# Patient Record
Sex: Male | Born: 1975 | Race: Black or African American | Hispanic: No | Marital: Married | Smoking: Never smoker
Health system: Southern US, Community
[De-identification: ages and names within clinical notes are randomized; demographics above are authoritative.]

## PROBLEM LIST (undated history)

## (undated) DIAGNOSIS — E669 Obesity, unspecified: Secondary | ICD-10-CM

## (undated) DIAGNOSIS — I10 Essential (primary) hypertension: Secondary | ICD-10-CM

## (undated) DIAGNOSIS — R0789 Other chest pain: Secondary | ICD-10-CM

## (undated) DIAGNOSIS — E66811 Obesity, class 1: Secondary | ICD-10-CM

## (undated) HISTORY — DX: Essential (primary) hypertension: I10

## (undated) HISTORY — DX: Other chest pain: R07.89

## (undated) HISTORY — DX: Obesity, unspecified: E66.9

## (undated) HISTORY — DX: Obesity, class 1: E66.811

---

## 2001-03-03 ENCOUNTER — Emergency Department (HOSPITAL_COMMUNITY): Admission: EM | Admit: 2001-03-03 | Discharge: 2001-03-03 | Payer: Self-pay

## 2003-05-09 ENCOUNTER — Emergency Department (HOSPITAL_COMMUNITY): Admission: EM | Admit: 2003-05-09 | Discharge: 2003-05-09 | Payer: Self-pay | Admitting: Emergency Medicine

## 2003-05-13 ENCOUNTER — Emergency Department (HOSPITAL_COMMUNITY): Admission: EM | Admit: 2003-05-13 | Discharge: 2003-05-13 | Payer: Self-pay | Admitting: Emergency Medicine

## 2013-03-24 ENCOUNTER — Other Ambulatory Visit: Payer: Self-pay | Admitting: Pulmonary Disease

## 2018-10-27 ENCOUNTER — Other Ambulatory Visit: Payer: Self-pay

## 2018-10-27 ENCOUNTER — Encounter (HOSPITAL_COMMUNITY): Payer: Self-pay

## 2018-10-27 ENCOUNTER — Emergency Department (HOSPITAL_COMMUNITY)
Admission: EM | Admit: 2018-10-27 | Discharge: 2018-10-27 | Disposition: A | Discharge status: Home / Self Care | Payer: 59 | Attending: Emergency Medicine | Admitting: Emergency Medicine

## 2018-10-27 DIAGNOSIS — R7989 Other specified abnormal findings of blood chemistry: Secondary | ICD-10-CM

## 2018-10-27 DIAGNOSIS — R42 Dizziness and giddiness: Secondary | ICD-10-CM

## 2018-10-27 DIAGNOSIS — R202 Paresthesia of skin: Secondary | ICD-10-CM

## 2018-10-27 LAB — CBC WITH DIFFERENTIAL/PLATELET
Abs Immature Granulocytes: 0 10*3/uL (ref 0.00–0.07)
Basophils Absolute: 0 10*3/uL (ref 0.0–0.1)
Basophils Relative: 1 %
Eosinophils Absolute: 0.2 10*3/uL (ref 0.0–0.5)
Eosinophils Relative: 5 %
HCT: 47.6 % (ref 39.0–52.0)
Hemoglobin: 16 g/dL (ref 13.0–17.0)
Immature Granulocytes: 0 %
Lymphocytes Relative: 38 %
Lymphs Abs: 1.4 10*3/uL (ref 0.7–4.0)
MCH: 29.2 pg (ref 26.0–34.0)
MCHC: 33.6 g/dL (ref 30.0–36.0)
MCV: 86.9 fL (ref 80.0–100.0)
Monocytes Absolute: 0.4 10*3/uL (ref 0.1–1.0)
Monocytes Relative: 10 %
Neutro Abs: 1.8 10*3/uL (ref 1.7–7.7)
Neutrophils Relative %: 46 %
Platelets: 195 10*3/uL (ref 150–400)
RBC: 5.48 MIL/uL (ref 4.22–5.81)
RDW: 12.2 % (ref 11.5–15.5)
WBC: 3.8 10*3/uL — ABNORMAL LOW (ref 4.0–10.5)
nRBC: 0 % (ref 0.0–0.2)

## 2018-10-27 LAB — URINALYSIS, ROUTINE W REFLEX MICROSCOPIC
Bilirubin Urine: NEGATIVE
Glucose, UA: NEGATIVE mg/dL
Hgb urine dipstick: NEGATIVE
Ketones, ur: NEGATIVE mg/dL
Leukocytes,Ua: NEGATIVE
Nitrite: NEGATIVE
Protein, ur: NEGATIVE mg/dL
Specific Gravity, Urine: 1.021 (ref 1.005–1.030)
pH: 6 (ref 5.0–8.0)

## 2018-10-27 LAB — BASIC METABOLIC PANEL
Anion gap: 9 (ref 5–15)
BUN: 20 mg/dL (ref 6–20)
CO2: 23 mmol/L (ref 22–32)
Calcium: 9 mg/dL (ref 8.9–10.3)
Chloride: 105 mmol/L (ref 98–111)
Creatinine, Ser: 1.38 mg/dL — ABNORMAL HIGH (ref 0.61–1.24)
GFR calc Af Amer: >60 mL/min (ref >60)
GFR calc non Af Amer: >60 mL/min (ref >60)
Glucose, Bld: 129 mg/dL — ABNORMAL HIGH (ref 70–99)
Potassium: 3.8 mmol/L (ref 3.5–5.1)
Sodium: 137 mmol/L (ref 135–145)

## 2018-10-27 MED ORDER — SODIUM CHLORIDE 0.9 % IV BOLUS
1000.0000 mL | Freq: Once | INTRAVENOUS | Status: AC
  Administered 2018-10-27: 1000 mL via INTRAVENOUS

## 2018-10-27 NOTE — ED Triage Notes (Signed)
Pt presents with c/o tingling in his right leg for the past 2 days. Pt also reports he went to UC 2 weeks ago because his heart was racing but pt denies any of those symptoms today. Pt report he did have some lightheadedness yesterday.

## 2018-10-27 NOTE — Discharge Instructions (Addendum)
You were seen in the ED today for tingling to your right foot as well as feelings of lightheadedness Your labwork was reassuring besides your kidney function (creatinine level) which was mildly elevated at 1.38. We have given you fluids in the ED. Please increase your fluid intake at home as well and follow up with your PCP during your appointment scheduled for 08/21. They will need to recheck this level at that time. It could be elevated due to dehydration or this could be your baseline level.  Please also follow up with them regarding the tingling sensation in your right foot.

## 2018-10-27 NOTE — ED Provider Notes (Signed)
[facility]-EMERGENCY DEPT Provider Note   CSN: [id number] Arrival date & time: 10/27/18  0725    History   Chief Complaint Chief Complaint  Patient presents with  . Tingling    HPI Gerald Gonzalez is a 43 y.o. male who presents to the ED today complaining of intermittent paresthesias to RLL, mostly in foot, x 2 days. Pt also complains of a feeling of lightheadedness yesterday. No syncope. No chest pain, shortness of breath, vision changes. He mentions being seen at an Urgent Care 2 weeks ago after feeling his heart racing - all bloodwork and EKG were normal at that time. Pt has an appointment scheduled for a PCP to establish care on 08/21. He has not felt his heart race since [facility]. Currently his only concern is his foot tingling. This happens intermittent. Pt can be up and walking or sitting down for an extended period of time prior to this occurring. It does not occur in his left leg. Denies back pain, neck pain, fever, chills, bowel or bladder incontinence, weakness or numbness, or any other associated symptoms.       History reviewed. No pertinent past medical history.  There are no active problems to display for this patient.   History reviewed. No pertinent surgical history.      Home Medications    Prior to Admission medications   Not on File    Family History No family history on file.  Social History Social History   Tobacco Use  . Smoking status: Never Smoker  . Smokeless tobacco: Never Used  Substance Use Topics  . Alcohol use: Never    Frequency: Never  . Drug use: Never     Allergies   Patient has no known allergies.   Review of Systems Review of Systems  Constitutional: Negative for chills and fever.  HENT: Negative for congestion.   Eyes: Negative for visual disturbance.  Respiratory: Negative for cough and shortness of breath.   Cardiovascular: Positive for palpitations (resolved).  Gastrointestinal:  Negative for abdominal pain, nausea and vomiting.  Genitourinary: Negative for difficulty urinating.  Musculoskeletal: Negative for myalgias.  Skin: Negative for rash.  Neurological: Positive for light-headedness. Negative for dizziness, speech difficulty, weakness, numbness and headaches.       + paresthesias     Physical Exam Updated Vital Signs BP (!) 133/91   Pulse 69   Temp 98 F (36.7 C) (Oral)   Resp 16   Ht 5\' 7"  (1.702 m)   Wt 99.8 kg   SpO2 99%   BMI 34.46 kg/m   Physical Exam Vitals signs and nursing note reviewed.  Constitutional:      Appearance: He is not ill-appearing.  HENT:     Head: Normocephalic and atraumatic.  Eyes:     Conjunctiva/sclera: Conjunctivae normal.  Neck:     Musculoskeletal: Neck supple.  Cardiovascular:     Rate and Rhythm: Normal rate and regular rhythm.     Pulses: Normal pulses.  Pulmonary:     Effort: Pulmonary effort is normal.     Breath sounds: Normal breath sounds. No wheezing, rhonchi or rales.  Abdominal:     Palpations: Abdomen is soft.     Tenderness: There is no abdominal tenderness. There is no guarding or rebound.  Musculoskeletal:     Comments: No C, T, or L midline spinal tenderness. No paraspinal tenderness. Negative SLR bilaterally. ROM intact to RLE at hip, knee, and ankle. Strength 5/5. Sensation  grossly intact. Good distal pulses.   Skin:    General: Skin is warm and dry.  Neurological:     Mental Status: He is alert.     Comments: CN 3-12 grossly intact A&O x4 GCS 15 Sensation and strength intact Gait nonataxic including with tandem walking Coordination with finger-to-nose WNL Neg pronator drift       ED Treatments / Results  Labs (all labs ordered are listed, but only abnormal results are displayed) Labs Reviewed  BASIC METABOLIC PANEL - Abnormal; Notable for the following components:      Result Value   Glucose, Bld 129 (*)    Creatinine, Ser 1.38 (*)    All other components within normal  limits  CBC WITH DIFFERENTIAL/PLATELET - Abnormal; Notable for the following components:   WBC 3.8 (*)    All other components within normal limits  URINALYSIS, ROUTINE W REFLEX MICROSCOPIC    EKG None  Radiology No results found.  Procedures Procedures (including critical care time)  Medications Ordered in ED Medications  sodium chloride 0.9 % bolus 1,000 mL (1,000 mLs Intravenous New Bag/Given 10/27/18 1001)     Initial Impression / Assessment and Plan / ED Course  I have reviewed the triage vital signs and the nursing notes.  Pertinent labs & imaging results that were available during my care of the patient were reviewed by me and considered in my medical decision making (see chart for details).    43 year old male presenting to the ED with complaints of right foot paresthesias x 2 days as well as lightheadedness yesterday. Paresthesias are intermittent. No weakness or numbness. Able to ambulate without difficulty. No focal neuro deficits on exam and strength/sensation intact. Lightheadedness yesterday without any other concerning symptoms. No syncope, HA, vision changes. Pt does not typically see a PCP; he has an appointment scheduled on 08/21 to establish care. Pt did not want to wait that long to be seen and was concerned about his symptoms. EKG was obtained in triage given pt had complaints of palpitations 2 weeks ago; he was seen at urgent care at that point and had a full workup which was negative. EKG today without ischemic changes, NSR. Will obtain baseline bloodwork and U/A to ensure pt does not have any electrolyte abnormalities to cause paresthesias and to check for dehydration given lightheadedness. Will reevaluate once labs return.   Pt with leukopenia of unknown cause. Do not have baseline to compare to. Remainder of CBC stable. No electrolyte abnormalities. Creatinine elevated at 1.38; again do not have baseline to compare. No decrease in GFR to suggest AKI. Question  whether this is pt's baseline vs elevated for an acute reason. Will give IVFs. Pt to have this level rechecked with his PCP in 2 weeks. U/A without infection and normal specific gravity.   Pt discharged home with instructions to increase fluid intake and to have creatinine level rechecked with PCP. He is also to address feelings of lightheadedness with his PCP. He is in agreement with plan at this time and stable for discharge home. Pt able to ambulate out of ED without difficulty today.       Final Clinical Impressions(s) / ED Diagnoses   Final diagnoses:  Paresthesia  Lightheadedness  Elevated serum creatinine    ED Discharge Orders    None       Eustaquio Maize, PA-C 10/27/18 1611    Julianne Rice, MD 11/02/18 1751

## 2018-10-27 NOTE — ED Notes (Signed)
ED Provider at bedside. 

## 2018-11-09 ENCOUNTER — Ambulatory Visit (INDEPENDENT_AMBULATORY_CARE_PROVIDER_SITE_OTHER): Payer: 59 | Admitting: Family Medicine

## 2018-11-09 ENCOUNTER — Other Ambulatory Visit: Payer: Self-pay

## 2018-11-09 ENCOUNTER — Encounter: Payer: Self-pay | Admitting: Family Medicine

## 2018-11-09 VITALS — BP 152/86 | HR 93 | Temp 97.2°F | Resp 17 | Ht 68.11 in | Wt 218.4 lb

## 2018-11-09 DIAGNOSIS — E669 Obesity, unspecified: Secondary | ICD-10-CM | POA: Insufficient documentation

## 2018-11-09 DIAGNOSIS — D72819 Decreased white blood cell count, unspecified: Secondary | ICD-10-CM

## 2018-11-09 DIAGNOSIS — R7989 Other specified abnormal findings of blood chemistry: Secondary | ICD-10-CM

## 2018-11-09 DIAGNOSIS — I10 Essential (primary) hypertension: Secondary | ICD-10-CM

## 2018-11-09 DIAGNOSIS — R002 Palpitations: Secondary | ICD-10-CM

## 2018-11-09 DIAGNOSIS — R202 Paresthesia of skin: Secondary | ICD-10-CM

## 2018-11-09 MED ORDER — LISINOPRIL 10 MG PO TABS: 10.0000 mg | ORAL_TABLET | Freq: Every day | ORAL | 1 refills | Status: DC

## 2018-11-09 NOTE — Progress Notes (Signed)
Office Note 11/09/2018  CC:  Chief Complaint  Patient presents with  . Establish Care    doesnt have a old pcp. felt dizzy and heart rate was increased. went to [facility] on 10/27/18. patient stated he feel fine.     HPI:  Gerald Gonzalez is a 43 y.o. male who is here to establish care and discuss palpitations. Patient's most recent primary MD: none Old records in EPIC/HL EMR (ED visit 10/27/18) reviewed prior to or during today's visit.  Dizziness, with HR elevation a few weeks ago, [facility]. [facility] ED visit for R LL/R foot intermittent paresthesias 10/27/18--exam normal, UA normal, EKG normal, labs showed mild elevation of sCr and mild leukopenia w/out any prior labs to compare these to. I reviewed all of these records today.  Feeling well.  Occ feeling of lightheadedness started in last few months--a couple episodes total.  No trigger except possible heat.  Felt racing heart once and this prompted the ED visit 10/27/18.  Was not hydrating well.  No excessive caffeine and no otc decongestants. Has been hydrating better lately.  Exercise: lifts wt's hits speed bag, jumps rope, sometimes walks, strenuous at workplace. Diet: Last couple weeks has cut out pork, drinking only water (no sodas).    Past Medical History:  Diagnosis Date  . Obesity, Class I, BMI 30-34.9     History reviewed. No pertinent surgical history.  Family History  Problem Relation Age of Onset  . Hypertension Mother   . Hypertension Father   . Stroke Father   . Hypertension Brother   . Dementia Maternal Grandfather   . Stroke Paternal Grandmother     Social History   Socioeconomic History  . Marital status: Single    Spouse name: Not on file  . Number of children: Not on file  . Years of education: Not on file  . Highest education level: Not on file  Occupational History  . Not on file  Social Needs  . Financial resource strain: Not on file  . Food insecurity     Worry: Not on file    Inability: Not on file  . Transportation needs    Medical: Not on file    Non-medical: Not on file  Tobacco Use  . Smoking status: Never Smoker  . Smokeless tobacco: Never Used  Substance and Sexual Activity  . Alcohol use: Never    Frequency: Never  . Drug use: Never  . Sexual activity: Not on file  Lifestyle  . Physical activity    Days per week: Not on file    Minutes per session: Not on file  . Stress: Not on file  Relationships  . Social Musicianconnections    Talks on phone: Not on file    Gets together: Not on file    Attends religious service: Not on file    Active member of club or organization: Not on file    Attends meetings of clubs or organizations: Not on file    Relationship status: Not on file  . Intimate partner violence    Fear of current or ex partner: Not on file    Emotionally abused: Not on file    Physically abused: Not on file    Forced sexual activity: Not on file  Other Topics Concern  . Not on file  Social History Narrative   Married, 1 son.   [place]ig from [place], [place]   [place]ives in [place], [place].  Works in Camera operator.   No T/A/Ds.   MEDS: none  No Known Allergies  ROS Review of Systems  Constitutional: Negative for fatigue and fever.  HENT: Negative for congestion and sore throat.   Eyes: Negative for visual disturbance.  Respiratory: Negative for cough.   Cardiovascular: Negative for chest pain.  Gastrointestinal: Negative for abdominal pain and nausea.  Genitourinary: Negative for dysuria.  Musculoskeletal: Negative for back pain and joint swelling.  Skin: Negative for rash.  Neurological: Negative for weakness and headaches.  Hematological: Negative for adenopathy.  Psychiatric/Behavioral: Negative for dysphoric mood.    PE; Initial bp today 155/83, repeat manual bp was 152/86. Blood pressure (!) 152/86, pulse 93, temperature (!) 97.2 F (36.2 C), temperature source Temporal, resp. rate  17, height 5' 8.11" (1.73 m), weight 218 lb 6 oz (99.1 kg), SpO2 100 %. Body mass index is 33.1 kg/m.  Gen: Alert, well appearing.  Patient is oriented to person, place, time, and situation. TMH:DQQI: no injection, icteris, swelling, or exudate.  EOMI, PERRLA. Mouth: lips without lesion/swelling.  Oral mucosa pink and moist. Oropharynx without erythema, exudate, or swelling.  Neck - No masses or thyromegaly or limitation in range of motion CV: RRR, no m/r/g.   LUNGS: CTA bilat, nonlabored resps, good aeration in all lung fields. ABD: soft, NT/ND EXT: no clubbing or cyanosis.  no edema.   Pertinent labs:  No results found for: TSH Lab Results  Component Value Date   WBC 3.8 (L) 10/27/2018   HGB 16.0 10/27/2018   HCT 47.6 10/27/2018   MCV 86.9 10/27/2018   PLT 195 10/27/2018   Lab Results  Component Value Date   CREATININE 1.38 (H) 10/27/2018   BUN 20 10/27/2018   NA 137 10/27/2018   K 3.8 10/27/2018   CL 105 10/27/2018   CO2 23 10/27/2018   ASSESSMENT AND PLAN:   New pt; no old records to obtain.  1) New dx HTN. DASH eating plan discussed and handout given to pt. Start lisinopril 10 mg qd. Recheck BMET and CBC today (mild sCr elevation 8/8 was likely from dehydration). TSH and magnesium today (palpitations, dizziness). Continue great exercise habits.  An After Visit Summary was printed and given to the patient.  Return in about 2 weeks (around 11/23/2018) for f/u HTN.  Signed:  Crissie Sickles, MD           11/09/2018

## 2018-11-09 NOTE — Patient Instructions (Signed)

## 2018-11-10 LAB — COMPREHENSIVE METABOLIC PANEL
AG Ratio: 1.7 (calc) (ref 1.0–2.5)
ALT: 24 U/L (ref 9–46)
AST: 24 U/L (ref 10–40)
Albumin: 4.3 g/dL (ref 3.6–5.1)
Alkaline phosphatase (APISO): 47 U/L (ref 36–130)
BUN: 15 mg/dL (ref 7–25)
CO2: 26 mmol/L (ref 20–32)
Calcium: 9.4 mg/dL (ref 8.6–10.3)
Chloride: 105 mmol/L (ref 98–110)
Creat: 1.25 mg/dL (ref 0.60–1.35)
Globulin: 2.6 g/dL (calc) (ref 1.9–3.7)
Glucose, Bld: 120 mg/dL — ABNORMAL HIGH (ref 65–99)
Potassium: 4.2 mmol/L (ref 3.5–5.3)
Sodium: 141 mmol/L (ref 135–146)
Total Bilirubin: 0.5 mg/dL (ref 0.2–1.2)
Total Protein: 6.9 g/dL (ref 6.1–8.1)

## 2018-11-10 LAB — CBC WITH DIFFERENTIAL/PLATELET
Absolute Monocytes: 387 cells/uL (ref 200–950)
Basophils Absolute: 32 cells/uL (ref 0–200)
Basophils Relative: 0.7 %
Eosinophils Absolute: 257 cells/uL (ref 15–500)
Eosinophils Relative: 5.7 %
HCT: 47.5 % (ref 38.5–50.0)
Hemoglobin: 15.9 g/dL (ref 13.2–17.1)
Lymphs Abs: 1967 cells/uL (ref 850–3900)
MCH: 29.1 pg (ref 27.0–33.0)
MCHC: 33.5 g/dL (ref 32.0–36.0)
MCV: 87 fL (ref 80.0–100.0)
MPV: 11 fL (ref 7.5–12.5)
Monocytes Relative: 8.6 %
Neutro Abs: 1859 cells/uL (ref 1500–7800)
Neutrophils Relative %: 41.3 %
Platelets: 240 10*3/uL (ref 140–400)
RBC: 5.46 10*6/uL (ref 4.20–5.80)
RDW: 13.1 % (ref 11.0–15.0)
Total Lymphocyte: 43.7 %
WBC: 4.5 10*3/uL (ref 3.8–10.8)

## 2018-11-10 LAB — TSH: TSH: 0.78 mIU/L (ref 0.40–4.50)

## 2018-11-10 LAB — MAGNESIUM: Magnesium: 2 mg/dL (ref 1.5–2.5)

## 2018-11-12 ENCOUNTER — Telehealth: Payer: Self-pay

## 2018-11-12 MED ORDER — HYDROCHLOROTHIAZIDE 25 MG PO TABS: 25.0000 mg | ORAL_TABLET | Freq: Every day | ORAL | 1 refills | Status: DC

## 2018-11-12 NOTE — Telephone Encounter (Signed)
Reassure him that this medication is a very good medication for high blood pressure.  What are his specific fears of the medication? Remind him that ALL medications have potential for side effects and I'm not going to be able to prescribe a medication that has ALL benefit and NO risk.  Let me know.-thx

## 2018-11-12 NOTE — Telephone Encounter (Signed)
Pt called stating he does not want to take the medication bevause of the coughing and the side effects. Pt states he has not taken any of the medication, but he is wanting something else prescribed for him. Please advise.   [id number] - [place], [address] - [address]  [address] [place] [address]  Phone: [phone] Fax: [phone]  Not a 24 hour pharmacy; exact hours not known.

## 2018-11-12 NOTE — Telephone Encounter (Signed)
Tried to reach patient to let him know Dr Anitra Lauth is out of town.  Will have to try again later as patient phone was 'not available'

## 2018-11-12 NOTE — Telephone Encounter (Signed)
Patient had a visit on 11/09/18 to establish care and started on Lisinopril 10mg . He read the side effects and would like something else prescribed. No side effects or reactions.  Please advise, thanks.

## 2018-11-12 NOTE — Telephone Encounter (Signed)
Patient states he is more concerned about side effect of cough due to Covid right now.  He states if he begins coughing at his job they won't let him work.  Patient aware Dr Anitra Lauth out of office until 11/20/2018.   Please advise if there is another RX for patient to try that has no side effect of cough.

## 2018-11-12 NOTE — Telephone Encounter (Signed)
Patient advised.

## 2018-11-12 NOTE — Telephone Encounter (Signed)
Understood. HCTZ eRx'd.

## 2018-11-13 ENCOUNTER — Telehealth: Payer: Self-pay

## 2018-11-13 NOTE — Telephone Encounter (Signed)
Patient called after hours regarding Lisinopril prescription. See phone note on 11/12/2018 for response.    [facility] - Client TELEPHONE ADVICE RECORD [facility] Patient Name: Gerald Gonzalez Gender: Male DOB: [birth year] Age: 43 Y 11 M 23 D Return Phone Number: [phone] (Primary) Address: City/State/Zip: [place] [address] Client [facility] [place] [facility] - Client Client Site [place] Night Physician Crissie Sickles - MD Contact Type Call Who Is Calling Patient / Member / Family / Caregiver Call Type Triage / Clinical Relationship To Patient Self Return Phone Number [phone] (Primary) Chief Complaint Prescription Refill or Medication Request (non symptomatic) Reason for Call Medication Question / Request Initial Comment Caller states that he is calling about regarding Lisinopril that he was prescribed and read the side effects ad would like something else prescribed. No symptoms Translation No Nurse Assessment Nurse: Leilani Merl, RN, Heather Date/Time (Eastern Time): 11/10/2018 10:41:03 AM Confirm and document reason for call. If symptomatic, describe symptoms. ---Caller states that he is calling about regarding Lisinopril that he was prescribed and read the side effects and would like something else prescribed. No symptoms Has the patient had close contact with a person known or suspected to have the novel coronavirus illness OR traveled / lives in area with major community spread (including international travel) in the last 14 days from the onset of symptoms? * If Asymptomatic, screen for exposure and travel within the last 14 days. ---No Does the patient have any new or worsening symptoms? ---No Please document clinical information provided and list any resource used. ---Caller will call the office on Monday to get prescription changed. Guidelines Guideline Title Affirmed Question Affirmed Notes  Nurse Date/Time (Eastern Time) Disp. Time Eilene Ghazi Time) Disposition Final User 11/10/2018 10:28:28 AM Send To RN Personal Myles Gip, RN, Haynes Dage 11/10/2018 10:49:14 AM Clinical Call Yes Standifer, RN, Nira Conn

## 2018-11-27 ENCOUNTER — Ambulatory Visit: Payer: 59 | Admitting: Family Medicine

## 2018-12-10 ENCOUNTER — Telehealth: Payer: Self-pay | Admitting: Family Medicine

## 2018-12-10 ENCOUNTER — Ambulatory Visit: Payer: 59 | Admitting: Family Medicine

## 2018-12-10 ENCOUNTER — Other Ambulatory Visit: Payer: Self-pay

## 2018-12-10 MED ORDER — HYDROCHLOROTHIAZIDE 25 MG PO TABS: 25.0000 mg | ORAL_TABLET | Freq: Every day | ORAL | 0 refills | Status: DC

## 2018-12-10 NOTE — Telephone Encounter (Signed)
Called patient to reschedule appt. Patient does not have enough HTN medication to make it to the rescheduled date. Please send in Rx to [facility]

## 2018-12-10 NOTE — Telephone Encounter (Signed)
Rx sent in to last until pt's appt and pt notified.

## 2018-12-21 ENCOUNTER — Ambulatory Visit: Payer: 59 | Admitting: Family Medicine

## 2019-01-03 ENCOUNTER — Other Ambulatory Visit: Payer: Self-pay

## 2019-01-03 ENCOUNTER — Encounter: Payer: Self-pay | Admitting: Family Medicine

## 2019-01-03 ENCOUNTER — Ambulatory Visit (INDEPENDENT_AMBULATORY_CARE_PROVIDER_SITE_OTHER): Payer: 59 | Admitting: Family Medicine

## 2019-01-03 VITALS — BP 130/82 | HR 90 | Temp 99.1°F | Resp 16 | Ht 68.0 in | Wt 218.4 lb

## 2019-01-03 DIAGNOSIS — I10 Essential (primary) hypertension: Secondary | ICD-10-CM | POA: Diagnosis not present

## 2019-01-03 MED ORDER — HYDROCHLOROTHIAZIDE 25 MG PO TABS: 25.0000 mg | ORAL_TABLET | Freq: Every day | ORAL | 1 refills | Status: DC

## 2019-01-03 NOTE — Patient Instructions (Signed)
Check your blood pressure at home at least once a week. Normal blood pressure is 130/80 or less. If consistently >140/90 then call or return to discuss adding an additional blood pressure medication.

## 2019-01-03 NOTE — Progress Notes (Signed)
OFFICE VISIT  01/03/2019   CC:  Chief Complaint  Patient presents with  . Follow-up    hypertension   HPI:    Patient is a 43 y.o.  male who presents for f/u HTN. Last visit I rx'd him lisinopril 10mg  qd. He read the side effect profile of this med and refused to take it (mainly afraid of the cough? so I changed him to hctz 25mg  qd.   He checked bp 1-2 times since last visit. Gives him "lots of gas" but otherwise feels better on the med. He really can't recall any numbers. Riding a bike and walking for exercise. Has cut back on sodas, increasing water intake.  Past Medical History:  Diagnosis Date  . Obesity, Class I, BMI 30-34.9     History reviewed. No pertinent surgical history.  Outpatient Medications Prior to Visit  Medication Sig Dispense Refill  . hydrochlorothiazide (HYDRODIURIL) 25 MG tablet Take 1 tablet (25 mg total) by mouth daily. 30 tablet 0   No facility-administered medications prior to visit.     No Known Allergies  ROS As per HPI  PE: Blood pressure 130/82, pulse 90, temperature 99.1 F (37.3 C), temperature source Temporal, resp. rate 16, height 5\' 8"  (1.727 m), weight 218 lb 6.4 oz (99.1 kg), SpO2 99 %. Body mass index is 33.21 kg/m.  Gen: Alert, well appearing.  Patient is oriented to person, place, time, and situation. AFFECT: pleasant, lucid thought and speech. He is very muscle-bound. CV: RRR, no m/r/g.   LUNGS: CTA bilat, nonlabored resps, good aeration in all lung fields. EXT: no clubbing or cyanosis.  no edema.    LABS:  Lab Results  Component Value Date   TSH 0.78 11/09/2018   Lab Results  Component Value Date   WBC 4.5 11/09/2018   HGB 15.9 11/09/2018   HCT 47.5 11/09/2018   MCV 87.0 11/09/2018   PLT 240 11/09/2018   Lab Results  Component Value Date   CREATININE 1.25 11/09/2018   BUN 15 11/09/2018   NA 141 11/09/2018   K 4.2 11/09/2018   CL 105 11/09/2018   CO2 26 11/09/2018   Lab Results  Component Value Date    ALT 24 11/09/2018   AST 24 11/09/2018   BILITOT 0.5 11/09/2018   No results found for: CHOL No results found for: HDL No results found for: LDLCALC No results found for: TRIG No results found for: CHOLHDL No results found for: PSA   IMPRESSION AND PLAN:  HTN: adequate control today. Needs to increase home bp monitoring, though. Instructions: Check your blood pressure at home at least once a week. Normal blood pressure is 130/80 or less. If consistently >140/90 then call or return to discuss adding an additional blood pressure medication.  BMET today to monitor sCr and lytes.  An After Visit Summary was printed and given to the patient.  FOLLOW UP: Return in about 6 months (around 07/04/2019) for annual CPE (fasting).  Signed:  Crissie Sickles, MD           01/03/2019

## 2019-01-04 LAB — BASIC METABOLIC PANEL
BUN: 17 mg/dL (ref 7–25)
CO2: 27 mmol/L (ref 20–32)
Calcium: 9.2 mg/dL (ref 8.6–10.3)
Chloride: 103 mmol/L (ref 98–110)
Creat: 1.1 mg/dL (ref 0.60–1.35)
Glucose, Bld: 88 mg/dL (ref 65–99)
Potassium: 3.7 mmol/L (ref 3.5–5.3)
Sodium: 138 mmol/L (ref 135–146)

## 2019-02-05 ENCOUNTER — Telehealth: Payer: Self-pay | Admitting: Family Medicine

## 2019-02-05 NOTE — Telephone Encounter (Signed)
Patient is feeling like his heart is racing. It only happens occasionally. Patient would like to make an office visit either virtual or in person. First opening is 02/13/19, patient would like sooner appointment.

## 2019-02-05 NOTE — Telephone Encounter (Signed)
Patient was advised to go to ED if keeps occurring more often. Appt was made for 11/25 with PCP for evaluation.

## 2019-02-08 ENCOUNTER — Other Ambulatory Visit: Payer: Self-pay

## 2019-02-08 ENCOUNTER — Encounter (HOSPITAL_COMMUNITY): Payer: Self-pay

## 2019-02-08 ENCOUNTER — Emergency Department (HOSPITAL_COMMUNITY): Payer: 59

## 2019-02-08 ENCOUNTER — Emergency Department (HOSPITAL_COMMUNITY)
Admission: EM | Admit: 2019-02-08 | Discharge: 2019-02-08 | Disposition: A | Discharge status: Home / Self Care | Payer: 59 | Attending: Emergency Medicine | Admitting: Emergency Medicine

## 2019-02-08 ENCOUNTER — Telehealth: Payer: Self-pay | Admitting: Family Medicine

## 2019-02-08 DIAGNOSIS — R079 Chest pain, unspecified: Secondary | ICD-10-CM | POA: Diagnosis present

## 2019-02-08 DIAGNOSIS — R0789 Other chest pain: Secondary | ICD-10-CM | POA: Diagnosis not present

## 2019-02-08 DIAGNOSIS — Z79899 Other long term (current) drug therapy: Secondary | ICD-10-CM | POA: Insufficient documentation

## 2019-02-08 LAB — CBC
HCT: 49.8 % (ref 39.0–52.0)
Hemoglobin: 16.9 g/dL (ref 13.0–17.0)
MCH: 29.4 pg (ref 26.0–34.0)
MCHC: 33.9 g/dL (ref 30.0–36.0)
MCV: 86.8 fL (ref 80.0–100.0)
Platelets: 232 10*3/uL (ref 150–400)
RBC: 5.74 MIL/uL (ref 4.22–5.81)
RDW: 12.2 % (ref 11.5–15.5)
WBC: 3.9 10*3/uL — ABNORMAL LOW (ref 4.0–10.5)
nRBC: 0 % (ref 0.0–0.2)

## 2019-02-08 LAB — BASIC METABOLIC PANEL
Anion gap: 11 (ref 5–15)
BUN: 19 mg/dL (ref 6–20)
CO2: 24 mmol/L (ref 22–32)
Calcium: 9.3 mg/dL (ref 8.9–10.3)
Chloride: 103 mmol/L (ref 98–111)
Creatinine, Ser: 1.24 mg/dL (ref 0.61–1.24)
GFR calc Af Amer: >60 mL/min (ref >60)
GFR calc non Af Amer: >60 mL/min (ref >60)
Glucose, Bld: 104 mg/dL — ABNORMAL HIGH (ref 70–99)
Potassium: 3.5 mmol/L (ref 3.5–5.1)
Sodium: 138 mmol/L (ref 135–145)

## 2019-02-08 LAB — TROPONIN I (HIGH SENSITIVITY)
Troponin I (High Sensitivity): 3 ng/L (ref <18)
Troponin I (High Sensitivity): 3 ng/L (ref <18)

## 2019-02-08 LAB — D-DIMER, QUANTITATIVE (NOT AT ARMC): D-Dimer, Quant: 0.35 ug/mL-FEU (ref 0.00–0.50)

## 2019-02-08 MED ORDER — ASPIRIN 81 MG PO CHEW
324.0000 mg | CHEWABLE_TABLET | Freq: Once | ORAL | Status: AC
  Administered 2019-02-08: 324 mg via ORAL
  Filled 2019-02-08: qty 4

## 2019-02-08 MED ORDER — SODIUM CHLORIDE 0.9% FLUSH: 3.0000 mL | Freq: Once | INTRAVENOUS | Status: DC

## 2019-02-08 NOTE — Telephone Encounter (Signed)
Patient is going to go to the ER due to symptoms of his heart racing. Will keep 02/13/19 appt with Dr. Anitra Lauth to follow up on ER visit.

## 2019-02-08 NOTE — ED Notes (Signed)
EKG given to [place], MD.

## 2019-02-08 NOTE — Discharge Instructions (Signed)
Your testing is reassuring.  There is no evidence of heart attack or blood clot in the lung.  As we discussed you are low risk for heart disease but not 0 risk.  You should follow-up with your doctor for stress test.  Return to the ED for chest pain becomes exertional, lasts for 20 to 30 minutes at a time with shortness of breath, nausea, vomiting and sweating.

## 2019-02-08 NOTE — ED Notes (Signed)
RETURNED FROM OUTSIDE 

## 2019-02-08 NOTE — ED Provider Notes (Signed)
[facility]-EMERGENCY DEPT Provider Note   CSN: [id number] Arrival date & time: 02/08/19  1204     History   Chief Complaint Chief Complaint  Patient presents with  . Chest Pain    HPI Constance HawJerry L Brummitt is a 43 y.o. male.     Patient with history of hypertension here with episodes of chest pain.  Scribes intermittent left-sided pain that has been coming and going since about 9 AM.  The pain is in the left side of his chest lasting for just a few seconds at a time.  He has had the pain about 4 or 5 times.  It is not exertional or pleuritic.  There is no radiation of the pain.  No shortness of breath, cough, fever, diaphoresis, chills.  He states he works in [employer] and normally has no problem lifting or twisting or going up and down stairs.  He was pulling a comealong behind him with a chain today shortly after the pain started but denies injuring himself.  Patient does not have any chest pain currently.  He states he has never had a stress test.  His only medical history is hypertension.  He does not smoke.  No history of early CAD. Not having chest pain currently.  The history is provided by the patient.  Chest Pain Associated symptoms: no abdominal pain, no back pain, no dizziness, no fever, no headache, no nausea, no shortness of breath, no vomiting and no weakness     Past Medical History:  Diagnosis Date  . Obesity, Class I, BMI 30-34.9     Patient Active Problem List   Diagnosis Date Noted  . Obesity (BMI 30-39.9) 11/09/2018    History reviewed. No pertinent surgical history.      Home Medications    Prior to Admission medications   Medication Sig Start Date End Date Taking? Authorizing Provider  hydrochlorothiazide (HYDRODIURIL) 25 MG tablet Take 1 tablet (25 mg total) by mouth daily. 01/03/19   McGowen, Maryjean MornPhilip H, MD    Family History Family History  Problem Relation Age of Onset  . Hypertension Mother   . Hypertension Father    . Stroke Father   . Hypertension Brother   . Dementia Maternal Grandfather   . Stroke Paternal Grandmother     Social History Social History   Tobacco Use  . Smoking status: Never Smoker  . Smokeless tobacco: Never Used  Substance Use Topics  . Alcohol use: Never    Frequency: Never  . Drug use: Never     Allergies   Patient has no known allergies.   Review of Systems Review of Systems  Constitutional: Negative for activity change, appetite change and fever.  HENT: Negative for congestion and rhinorrhea.   Eyes: Negative for visual disturbance.  Respiratory: Positive for chest tightness. Negative for shortness of breath.   Cardiovascular: Positive for chest pain.  Gastrointestinal: Negative for abdominal pain, nausea and vomiting.  Genitourinary: Negative for dysuria and hematuria.  Musculoskeletal: Negative for arthralgias, back pain and myalgias.  Skin: Negative for rash.  Neurological: Negative for dizziness, weakness and headaches.    all other systems are negative except as noted in the HPI and PMH.    Physical Exam Updated Vital Signs BP (!) 141/93 (BP Location: Left Arm)   Pulse (!) 108   Temp 99.9 F (37.7 C) (Oral)   Resp 20   Ht 5\' 8"  (1.727 m)   Wt 98.4 kg   SpO2 100%  BMI 32.99 kg/m   Physical Exam Vitals signs and nursing note reviewed.  Constitutional:      General: He is not in acute distress.    Appearance: He is well-developed. He is obese.  HENT:     Head: Normocephalic and atraumatic.     Mouth/Throat:     Pharynx: No oropharyngeal exudate.  Eyes:     Conjunctiva/sclera: Conjunctivae normal.     Pupils: Pupils are equal, round, and reactive to light.  Neck:     Musculoskeletal: Normal range of motion and neck supple.     Comments: No meningismus. Cardiovascular:     Rate and Rhythm: Normal rate and regular rhythm.     Heart sounds: Normal heart sounds. No murmur.     Comments: Equal radial pulses and grip strengths Pulmonary:      Effort: Pulmonary effort is normal. No respiratory distress.     Breath sounds: Normal breath sounds.  Chest:     Chest wall: No tenderness.  Abdominal:     Palpations: Abdomen is soft.     Tenderness: There is no abdominal tenderness. There is no guarding or rebound.  Musculoskeletal: Normal range of motion.        General: No tenderness.  Skin:    General: Skin is warm.     Capillary Refill: Capillary refill takes less than 2 seconds.  Neurological:     General: No focal deficit present.     Mental Status: He is alert and oriented to person, place, and time. Mental status is at baseline.     Cranial Nerves: No cranial nerve deficit.     Motor: No abnormal muscle tone.     Coordination: Coordination normal.     Comments: No ataxia on finger to nose bilaterally. No pronator drift. 5/5 strength throughout. CN 2-12 intact.Equal grip strength. Sensation intact.   Psychiatric:        Behavior: Behavior normal.      ED Treatments / Results  Labs (all labs ordered are listed, but only abnormal results are displayed) Labs Reviewed  BASIC METABOLIC PANEL - Abnormal; Notable for the following components:      Result Value   Glucose, Bld 104 (*)    All other components within normal limits  CBC - Abnormal; Notable for the following components:   WBC 3.9 (*)    All other components within normal limits  D-DIMER, QUANTITATIVE (NOT AT [facility])  TROPONIN I (HIGH SENSITIVITY)  TROPONIN I (HIGH SENSITIVITY)    EKG EKG Interpretation  Date/Time:  Friday February 08 2019 12:18:32 EST Ventricular Rate:  92 PR Interval:  142 QRS Duration: 88 QT Interval:  358 QTC Calculation: 442 R Axis:   89 Text Interpretation: Normal sinus rhythm Right atrial enlargement Borderline ECG No significant change was found Confirmed by Ezequiel Essex [phone]) on 02/08/2019 3:26:49 PM   Radiology Dg Chest 2 View  Result Date: 02/08/2019 CLINICAL DATA:  Chest pain EXAM: CHEST - 2 VIEW COMPARISON:   05/09/2003 FINDINGS: The heart size and mediastinal contours are within normal limits. Both lungs are clear. The visualized skeletal structures are unremarkable. IMPRESSION: No acute abnormality of the lungs. Electronically Signed   By: Eddie Candle M.D.   On: 02/08/2019 13:14    Procedures Procedures (including critical care time)  Medications Ordered in ED Medications  sodium chloride flush (NS) 0.9 % injection 3 mL (has no administration in time range)  aspirin chewable tablet 324 mg (324 mg Oral Given 02/08/19 1539)  Initial Impression / Assessment and Plan / ED Course  I have reviewed the triage vital signs and the nursing notes.  Pertinent labs & imaging results that were available during my care of the patient were reviewed by me and considered in my medical decision making (see chart for details).       Intermittent chest pain since 9 AM lasting a few seconds at a time.  His EKG shows no acute ischemia. Description of pain is atypical for ACS. Patient states he exercises regularly without chest pain and showed a video on his phone of him bench pressing over 300 pounds.  Initial troponin is negative Heart score 2.  Patient remains chest pain-free.  Troponin negative x2.  D-dimer is negative. CXR negative.  Low suspicion for ACS, PE, or dissection.  Discussed with patient that he is low risk for ACS but not 0 risk.  Recommend outpatient follow-up for stress test.  Follow-up with PCP as well as cardiologist.  Return precautions discussed. Return to the ED if chest pain, exertional, associated with nausea, vomiting, sweating, shortness of breath lasting for 20 or more minutes at a time. Final Clinical Impressions(s) / ED Diagnoses   Final diagnoses:  Atypical chest pain    ED Discharge Orders    None       Tayte Childers, Jeannett Senior, MD 02/08/19 1749

## 2019-02-08 NOTE — Telephone Encounter (Signed)
FYI

## 2019-02-08 NOTE — ED Triage Notes (Signed)
Pt states he is having sternal chest pain since this morning. Pt denies N/V/D, denies cough, denies SHOB.

## 2019-02-09 NOTE — Telephone Encounter (Signed)
Noted  

## 2019-02-13 ENCOUNTER — Encounter: Payer: Self-pay | Admitting: Family Medicine

## 2019-02-13 ENCOUNTER — Other Ambulatory Visit: Payer: Self-pay

## 2019-02-13 ENCOUNTER — Ambulatory Visit: Payer: 59 | Admitting: Family Medicine

## 2019-02-13 VITALS — BP 138/89 | HR 78 | Temp 99.2°F | Resp 16 | Ht 68.0 in | Wt 219.6 lb

## 2019-02-13 DIAGNOSIS — R0789 Other chest pain: Secondary | ICD-10-CM | POA: Diagnosis not present

## 2019-02-13 DIAGNOSIS — R002 Palpitations: Secondary | ICD-10-CM | POA: Diagnosis not present

## 2019-02-13 NOTE — Progress Notes (Signed)
OFFICE VISIT  02/13/2019   CC:  Chief Complaint  Patient presents with  . Heart racing  . Chest Pain   HPI:    Patient is a 43 y.o. African-American male who presents for heart racing and chest pain. Of note, he went to the ED for CP 02/08/19->reviewed all records today. Described atypical CP that day.  Troponin neg x 2, d dimer neg, CXR neg, EKG wnl.  CBC and BMET normal. D/c'd home on no new meds, instructions to f/u with me and with a cardiologist.  About 3 d/a he noted a sensation of his heart racing and slightly lightheaded, lasted approx 5 min. No CP, no SOB, no nausea or diaphoresis.  He went home from work and felt fine the rest of the day. No signif increase in stress/anxiety or caffeine intake. Occ home bp check and he recalls no specific numbers but felt like it was normal.  ROS: occ focal ache in chest, various areas, triggered by changes in upper body position.  Noo wheezing, no cough,no HAs, no rashes, no melena/hematochezia.  No polyuria or polydipsia.  No myalgias or arthralgias.   Past Medical History:  Diagnosis Date  . Atypical chest pain   . Hypertension   . Obesity, Class I, BMI 30-34.9     History reviewed. No pertinent surgical history.  Outpatient Medications Prior to Visit  Medication Sig Dispense Refill  . hydrochlorothiazide (HYDRODIURIL) 25 MG tablet Take 1 tablet (25 mg total) by mouth daily. 90 tablet 1   No facility-administered medications prior to visit.     No Known Allergies  ROS As per HPI  PE: Blood pressure 138/89, pulse 78, temperature 99.2 F (37.3 C), temperature source Temporal, resp. rate 16, height 5\' 8"  (1.727 m), weight 219 lb 9.6 oz (99.6 kg), SpO2 98 %. Body mass index is 33.39 kg/m.  Gen: Alert, well appearing.  Patient is oriented to person, place, time, and situation. AFFECT: pleasant, lucid thought and speech. : no injection, icteris, swelling, or exudate.  EOMI, PERRLA. Mouth: lips without  lesion/swelling.  Oral mucosa pink and moist. Oropharynx without erythema, exudate, or swelling.  CV: RRR, no m/r/g.   LUNGS: CTA bilat, nonlabored resps, good aeration in all lung fields. ABD: soft, NT/ND EXT: no clubbing or cyanosis.  no edema.    LABS:  Lab Results  Component Value Date   TSH 0.78 11/09/2018   Lab Results  Component Value Date   WBC 3.9 (L) 02/08/2019   HGB 16.9 02/08/2019   HCT 49.8 02/08/2019   MCV 86.8 02/08/2019   PLT 232 02/08/2019   Lab Results  Component Value Date   CREATININE 1.24 02/08/2019   BUN 19 02/08/2019   NA 138 02/08/2019   K 3.5 02/08/2019   CL 103 02/08/2019   CO2 24 02/08/2019   Lab Results  Component Value Date   ALT 24 11/09/2018   AST 24 11/09/2018   BILITOT 0.5 11/09/2018   Lab Results  Component Value Date   DDIMER 0.35 02/08/2019   EKG 02/08/19: NSR, rate 72, no ectopy or ischemia.  No hypertrophy.  Normal intervals and duration.   IMPRESSION AND PLAN:  1) Palpitations: isolated episode, unknown etiology. No real worrisome features, but if becomes recurrent or longer or with more symptoms then will do echo and holter monitor.  Recent lytes normal, TSH normal 10/2018.  EKG 02/08/19 reassuring. Some recent atypical CP c/w chest wall muscular pain.  He'll back off on intensity of weight lifting  temporarily. Signs/symptoms to call or return for were reviewed and pt expressed understanding.  An After Visit Summary was printed and given to the patient.  FOLLOW UP: Return in about 2 weeks (around 02/27/2019) for f/u palpitations and atypical CP.  Signed:  Crissie Sickles, MD           02/13/2019

## 2019-06-23 ENCOUNTER — Encounter (HOSPITAL_BASED_OUTPATIENT_CLINIC_OR_DEPARTMENT_OTHER): Payer: Self-pay | Admitting: Emergency Medicine

## 2019-06-23 ENCOUNTER — Emergency Department (HOSPITAL_BASED_OUTPATIENT_CLINIC_OR_DEPARTMENT_OTHER)
Admission: EM | Admit: 2019-06-23 | Discharge: 2019-06-23 | Disposition: A | Discharge status: Home / Self Care | Payer: 59 | Attending: Emergency Medicine | Admitting: Emergency Medicine

## 2019-06-23 ENCOUNTER — Other Ambulatory Visit: Payer: Self-pay

## 2019-06-23 ENCOUNTER — Emergency Department (HOSPITAL_BASED_OUTPATIENT_CLINIC_OR_DEPARTMENT_OTHER): Payer: 59

## 2019-06-23 DIAGNOSIS — Y999 Unspecified external cause status: Secondary | ICD-10-CM | POA: Diagnosis not present

## 2019-06-23 DIAGNOSIS — Y929 Unspecified place or not applicable: Secondary | ICD-10-CM | POA: Diagnosis not present

## 2019-06-23 DIAGNOSIS — Z23 Encounter for immunization: Secondary | ICD-10-CM | POA: Diagnosis not present

## 2019-06-23 DIAGNOSIS — S61213A Laceration without foreign body of left middle finger without damage to nail, initial encounter: Secondary | ICD-10-CM | POA: Insufficient documentation

## 2019-06-23 DIAGNOSIS — Y93H2 Activity, gardening and landscaping: Secondary | ICD-10-CM | POA: Diagnosis not present

## 2019-06-23 DIAGNOSIS — I10 Essential (primary) hypertension: Secondary | ICD-10-CM | POA: Insufficient documentation

## 2019-06-23 DIAGNOSIS — S61215A Laceration without foreign body of left ring finger without damage to nail, initial encounter: Secondary | ICD-10-CM

## 2019-06-23 DIAGNOSIS — Z79899 Other long term (current) drug therapy: Secondary | ICD-10-CM | POA: Insufficient documentation

## 2019-06-23 DIAGNOSIS — W293XXA Contact with powered garden and outdoor hand tools and machinery, initial encounter: Secondary | ICD-10-CM | POA: Insufficient documentation

## 2019-06-23 DIAGNOSIS — S6992XA Unspecified injury of left wrist, hand and finger(s), initial encounter: Secondary | ICD-10-CM | POA: Diagnosis present

## 2019-06-23 MED ORDER — TETANUS-DIPHTH-ACELL PERTUSSIS 5-2.5-18.5 LF-MCG/0.5 IM SUSP
0.5000 mL | Freq: Once | INTRAMUSCULAR | Status: AC
  Administered 2019-06-23: 0.5 mL via INTRAMUSCULAR
  Filled 2019-06-23: qty 0.5

## 2019-06-23 MED ORDER — LIDOCAINE HCL (PF) 1 % IJ SOLN
15.0000 mL | Freq: Once | INTRAMUSCULAR | Status: AC
  Administered 2019-06-23: 20:00:00 15 mL
  Filled 2019-06-23: qty 15

## 2019-06-23 NOTE — Discharge Instructions (Addendum)
You were seen in the emergency department today for lacerations to your 3rd/4th fingers. Your xray did not show any fractures. Your laceration of your 3rd finger was closed with 2 stitches and the 4th finger was closed with 3 stitches. Please keep this area clean and dry for the next 24 hours, after 24 hours you may get this area wet, but avoid soaking the area. Keep the area covered as best possible especially when in the sun to help in minimizing scarring.   Your tetanus has been updated   You will need to have the stitches removed and the wound rechecked in 7 days. Please return to the emergency department, go to an urgent care, or see your primary care provider to have this performed. Return to the ER soon should you start to experience pus type drainage from the wound, redness around the wound, or fevers as this could indicate the area is infected, please return to the ER for any other worsening symptoms or concerns that you may have.

## 2019-06-23 NOTE — ED Triage Notes (Signed)
Pt reports lac to LT middle and ring finger with electric hedge trimmer about 45 min PTA

## 2019-06-23 NOTE — ED Provider Notes (Signed)
[facility] EMERGENCY DEPARTMENT Provider Note   CSN: [id number] Arrival date & time: 06/23/19  1816     History Chief Complaint  Patient presents with  . Laceration    Gerald Gonzalez is a 44 y.o. male with a history of hypertension who presents to the emergency department with complaints of left hand injury which occurred around 1700 this afternoon.  Patient states that he was using a pair of electric hedge tremors when he accidentally cut the left middle and fourth fingers.  States the area is painful, 8 out of 10 in severity, no alleviating or aggravating factors.  No intervention prior to arrival.  Unknown last tetanus.  Denies numbness, tingling, weakness, fever, chills, or other areas of injury.  HPI     Past Medical History:  Diagnosis Date  . Atypical chest pain   . Hypertension   . Obesity, Class I, BMI 30-34.9     Patient Active Problem List   Diagnosis Date Noted  . Obesity (BMI 30-39.9) 11/09/2018    History reviewed. No pertinent surgical history.     Family History  Problem Relation Age of Onset  . Hypertension Mother   . Hypertension Father   . Stroke Father   . Hypertension Brother   . Dementia Maternal Grandfather   . Stroke Paternal Grandmother     Social History   Tobacco Use  . Smoking status: Never Smoker  . Smokeless tobacco: Never Used  Substance Use Topics  . Alcohol use: Never  . Drug use: Never    Home Medications Prior to Admission medications   Medication Sig Start Date End Date Taking? Authorizing Provider  hydrochlorothiazide (HYDRODIURIL) 25 MG tablet Take 1 tablet (25 mg total) by mouth daily. 01/03/19   McGowen, Adrian Blackwater, MD    Allergies    Patient has no known allergies.  Review of Systems   Review of Systems  Constitutional: Negative for chills and fever.  Respiratory: Negative for shortness of breath.   Cardiovascular: Negative for chest pain.  Gastrointestinal: Negative for abdominal pain.    Musculoskeletal: Positive for arthralgias.  Skin: Positive for wound.  Neurological: Negative for weakness and numbness.    Physical Exam Updated Vital Signs BP (!) 141/87   Pulse 88   Temp 98.5 F (36.9 C) (Oral)   Resp 18   Ht 5\' 8"  (1.727 m)   Wt 99.8 kg   SpO2 100%   BMI 33.45 kg/m   Physical Exam Vitals and nursing note reviewed.  Constitutional:      General: He is not in acute distress.    Appearance: Normal appearance. He is not ill-appearing or toxic-appearing.  HENT:     Head: Normocephalic and atraumatic.  Neck:     Comments: No midline tenderness.  Cardiovascular:     Rate and Rhythm: Normal rate.     Pulses:          Radial pulses are 2+ on the right side and 2+ on the left side.  Pulmonary:     Effort: No respiratory distress.     Breath sounds: Normal breath sounds.  Musculoskeletal:     Cervical back: Normal range of motion and neck supple.     Comments: Upper extremities: 4th distal phalanx there is a V shaped laceration to the palmar aspect that is a total of 1cm in length, 2-3 mm depth. 3rd distal phalanx there is an irregular shaped laceration to the palmar aspect that is 1 cm in length, 2-3  mm depth. No active bleeding, no appreciable FBs. NVI distally. Intact AROM. Able to flex/extend IP joints against resistance. Tender to 3rd/4th distal phalanges.   Skin:    General: Skin is warm and dry.     Capillary Refill: Capillary refill takes less than 2 seconds.  Neurological:     Mental Status: He is alert.     Comments: Alert. Clear speech. Sensation grossly intact to bilateral upper extremities. 5/5 symmetric grip strength. Ambulatory.   Psychiatric:        Mood and Affect: Mood normal.        Behavior: Behavior normal.     ED Results / Procedures / Treatments   Labs (all labs ordered are listed, but only abnormal results are displayed) Labs Reviewed - No data to display  EKG None  Radiology DG Hand Complete Left  Result Date:  06/23/2019 CLINICAL DATA:  Injury, lacerations to third and fourth digits. EXAM: LEFT HAND - COMPLETE 3+ VIEW COMPARISON:  None. FINDINGS: Osseous alignment is normal. No fracture line or displaced fracture fragment seen. No foreign body appreciated within the soft tissues. IMPRESSION: Negative. Electronically Signed   By: Bary Richard M.D.   On: 06/23/2019 20:19    Procedures .Marland KitchenLaceration Repair  Date/Time: 06/23/2019 9:09 PM Performed by: Cherly Anderson, PA-C Authorized by: Cherly Anderson, PA-C   Consent:    Consent obtained:  Verbal   Consent given by:  Patient   Risks discussed:  Infection, need for additional repair, nerve damage, poor wound healing, vascular damage, tendon damage, poor cosmetic result, pain and retained foreign body   Alternatives discussed:  No treatment Anesthesia (see MAR for exact dosages):    Anesthesia method:  Nerve block   Block needle gauge:  27 G   Block anesthetic:  Lidocaine 1% w/o epi   Block injection procedure:  Anatomic landmarks identified, negative aspiration for blood, anatomic landmarks palpated, incremental injection and introduced needle Laceration details:    Location:  Finger   Finger location:  L long finger   Length (cm):  1   Depth (mm):  3 Repair type:    Repair type:  Simple Pre-procedure details:    Preparation:  Patient was prepped and draped in usual sterile fashion and imaging obtained to evaluate for foreign bodies Exploration:    Hemostasis achieved with:  Direct pressure   Wound exploration: wound explored through full range of motion and entire depth of wound probed and visualized   Treatment:    Area cleansed with:  Betadine   Amount of cleaning:  Standard   Irrigation solution:  Sterile water   Irrigation method:  Pressure wash Skin repair:    Repair method:  Sutures   Suture size:  4-0   Suture material:  Nylon   Suture technique:  Simple interrupted   Number of sutures:  2 Approximation:     Approximation:  Close Post-procedure details:    Dressing:  Non-adherent dressing and splint for protection   Patient tolerance of procedure:  Tolerated well, no immediate complications .Marland KitchenLaceration Repair  Date/Time: 06/23/2019 9:10 PM Performed by: Cherly Anderson, PA-C Authorized by: Cherly Anderson, PA-C   Consent:    Consent obtained:  Verbal   Consent given by:  Patient   Risks discussed:  Infection, need for additional repair, nerve damage, poor wound healing, poor cosmetic result, retained foreign body, tendon damage, vascular damage and pain   Alternatives discussed:  No treatment Anesthesia (see MAR for exact dosages):  Anesthesia method:  Nerve block   Block needle gauge:  27 G   Block anesthetic:  Lidocaine 1% w/o epi   Block injection procedure:  Anatomic landmarks identified, anatomic landmarks palpated, negative aspiration for blood, introduced needle and incremental injection   Block outcome:  Anesthesia achieved Laceration details:    Location:  Finger   Finger location:  L ring finger   Length (cm):  1   Depth (mm):  3 Repair type:    Repair type:  Simple Pre-procedure details:    Preparation:  Patient was prepped and draped in usual sterile fashion and imaging obtained to evaluate for foreign bodies Exploration:    Hemostasis achieved with:  Direct pressure   Wound exploration: wound explored through full range of motion and entire depth of wound probed and visualized     Contaminated: no   Treatment:    Area cleansed with:  Betadine   Amount of cleaning:  Standard   Irrigation solution:  Sterile water   Irrigation method:  Pressure wash Skin repair:    Repair method:  Sutures   Suture size:  4-0   Suture material:  Nylon   Suture technique:  Simple interrupted   Number of sutures:  3 Approximation:    Approximation:  Close Post-procedure details:    Dressing:  Non-adherent dressing and splint for protection   Patient tolerance of  procedure:  Tolerated well, no immediate complications   (including critical care time)  Medications Ordered in ED Medications  lidocaine (PF) (XYLOCAINE) 1 % injection 15 mL (15 mLs Infiltration Given by Other 06/23/19 2007)  Tdap (BOOSTRIX) injection 0.5 mL (0.5 mLs Intramuscular Given 06/23/19 2008)    ED Course  I have reviewed the triage vital signs and the nursing notes.  Pertinent labs & imaging results that were available during my care of the patient were reviewed by me and considered in my medical decision making (see chart for details).    MDM Rules/Calculators/A&P                      Patient presents to the emergency department with lacerations to the 3rd/4th palmar aspect of the distal phalanges of the L hand which occurred @ 1700. Patient nontoxic appearing, resting comfortably. X-ray obtained in area of laceration, no fractures/dislocations or apparent radiopaque foreign bodies. Pressure irrigation performed. Wounds explored and base of wounds visualized in a bloodless field without evidence of foreign body. Laceration repairs per procedure note above, tolerated well. Tetanus updated at today's visit. Finger splints applied @ patient's wife's request. Discussed suture home care as well as need for wound recheck and suture removal in 7 days.  I discussed results, treatment plan, need for follow-up, and return precautions with the patient including signs of infection. Provided opportunity for questions, patient confirmed understanding and is in agreement with plan.     Final Clinical Impression(s) / ED Diagnoses Final diagnoses:  Laceration of left middle finger without foreign body without damage to nail, initial encounter  Laceration of left ring finger without foreign body without damage to nail, initial encounter    Rx / DC Orders ED Discharge Orders    None       Desmond Lope 06/23/19 2115    Tilden Fossa, MD 06/24/19 Rich Fuchs

## 2019-06-27 ENCOUNTER — Other Ambulatory Visit: Payer: Self-pay | Admitting: Family Medicine

## 2019-07-08 ENCOUNTER — Encounter: Payer: 59 | Admitting: Family Medicine

## 2019-07-19 ENCOUNTER — Encounter: Payer: Self-pay | Admitting: Family Medicine

## 2019-07-19 ENCOUNTER — Other Ambulatory Visit: Payer: Self-pay

## 2019-07-19 ENCOUNTER — Ambulatory Visit (INDEPENDENT_AMBULATORY_CARE_PROVIDER_SITE_OTHER): Payer: 59 | Admitting: Family Medicine

## 2019-07-19 VITALS — BP 129/88 | HR 85 | Temp 98.8°F | Resp 16 | Ht 69.0 in | Wt 215.8 lb

## 2019-07-19 DIAGNOSIS — Z0000 Encounter for general adult medical examination without abnormal findings: Secondary | ICD-10-CM

## 2019-07-19 DIAGNOSIS — Z1211 Encounter for screening for malignant neoplasm of colon: Secondary | ICD-10-CM | POA: Diagnosis not present

## 2019-07-19 DIAGNOSIS — I10 Essential (primary) hypertension: Secondary | ICD-10-CM | POA: Diagnosis not present

## 2019-07-19 NOTE — Patient Instructions (Signed)
At [facility], we are fully committed to providing effective and safe vaccines to eliminate COVID-19 in our communities.  Get Your Vaccine - All Phases Now Eligible Everyone 16 and older who wants a COVID-19 vaccine can get one through [facility]. Please note that only those age 46 and older can register for an appointment at our [place] clinic. This site distributes the Moderna vaccine, which is authorized for those age 46 and older. All other sites are open to those age 16 and older.     Schedule Your Vaccine Appointment at [email or web address]  [place] is opening appointments weekly, based on the number of vaccine doses provided by the state. To receive email notices about when we will open additional appointments each week, please sign up for our weekly email with information about COVID-19 vaccine appointment availability.   [facility] has created mass vaccination clinics in [place], [place] and [place] counties in partnership with [place], [place] and [place] health departments. Vaccination at these clinics is by appointment only. Walk-ins will NOT be accepted.  [place] is opening appointments weekly, based on the number of vaccine doses provided by the state. To receive email notices about when we will open additional appointments each week, please sign up for our weekly email with information about COVID-19 vaccine appointment availability   There are Currently Three Other Vaccination Opportunities in Our Area:  1. FEMA Community Vaccination Center ([place]) Eligible members of the public can make appointments online for a vaccine at the federally supported COVID-19 Community Vaccination Center in [place] at [address] [place]. Appointments can be made online by visiting [email or web address].  The FEMA site will operate  seven days a week with the capacity to provide up to 3,000 vaccinations per day, with options for drive-thru service in the parking lot and service at an indoor clinic. The federal government will provide the center's vaccine supply, which will be in addition to supply [facility] receives through [facility]'s weekly allotment from the Centers for Disease Control.  2. County Health Departments County health departments in 's service area are also scheduling vaccine appointments for those eligible. Learn more about each county's vaccination efforts in the website links below:  - [place]  - [place]  - [place]  -   - [place]  3. [facility] throughout [place] are offering COVID-19 vaccines to eligible individuals. Click here to schedule appointments and learn more.  Find all vaccine locations throughout the state of [place] at [email or web address]://[email or web address].[place].gov/  Vaccine Safety and Effectiveness All three vaccines authorized for emergency use by the U.S. Food and Drug Administration are recommended by the U.S. Centers for Disease Control and Prevention as safe and effective treatments for the prevention of COVID-19. These include the Pfizer, Moderna and Johnson & Johnson vaccines.  Clinical trials involving tens of thousands of people for each vaccine have shown that the vaccines are extremely effective in preventing COVID-19 with no serious safety concerns. Side effects reported include a sore arm at the injection site, fatigue, headache, chills and fever. While side effects are sometimes higher than for a typical flu vaccine, they are lower in many ways than side effects from the leading vaccine to prevent shingles.  Side effects are signs that a vaccine is working and are related to your immune system being stimulated to produce antibodies against infection. Side effects from vaccination are far less significant than health impacts from COVID-19.  Staying  Informed Pharmacists, infectious disease   doctors, critical care nurses and other experts at [facility] continue to speak publicly through media interviews and direct communication with our patients and communities about the safety, effectiveness and importance of vaccines to eliminate COVID-19.  In addition, reliable information on vaccine safety, effectiveness, side effects and more is available on the following websites:  [place] Department of Health and Human Services COVID-19 Vaccine Information Website.  U.S. Centers for Disease Control and Prevention COVID-19 Vaccine Public Information Website.  Staying Safe We agree with the CDC on what we can do to help our communities get back to normal: Getting "back to normal" is going to take all of our tools. If we use all the tools we have, we stand the best chance of getting our families, communities, schools and workplaces "back to normal" sooner:  Get vaccinated as soon as vaccines become available within the phase of the state's vaccination rollout plan for which you meet the eligibility criteria.  Wear a mask.  Stay 6 feet from others and avoid crowds.  Wash hands often.  Masking, social distancing and hand hygiene should be followed by everyone, including those who have been vaccinated.  For our most current information, please visit [email or web address].  

## 2019-07-19 NOTE — Progress Notes (Signed)
CC:  Gerald Gonzalez is a 44 y/o male with a PMH of HTN, atypical angina, and obesity (very muscular) who presents for his annual health maintenance exam.  HPI:   BP at office: 129/88 BP at home: 120s/80s he thinks but cannot remember for sure  Tolerating meds mostly; feels a little tired sometimes after taking but not sure if that is from not sleeping a lot the night before ; no headaches, dizziness No Pre/syncope Urinating more during the day but not waking up at night - not bothering him  No leg swelling No joint/muscle pain Remembering to take medicine well  Right arm popping a lot on lifting weights the past few weeks but not really painful, just something he notices   Diet: no sodas, drinks some sweet teas, eating [place], fruit, vegetables, very little red meat  No alcohol or tobacco   Exercise: lifts weights, riding bike 5x/wk    Has not been feeling much chest pain but last time was about a month ago on the right side of his chest  -other docs thought it was costochondritis    PMH: Past Medical History:  Diagnosis Date  . Atypical chest pain   . Hypertension   . Obesity, Class I, BMI 30-34.9     M/A: Current Outpatient Medications on File Prior to Visit  Medication Sig Dispense Refill  . hydrochlorothiazide (HYDRODIURIL) 25 MG tablet TAKE 1 TABLET BY MOUTH EVERY DAY 30 tablet 0   No current facility-administered medications on file prior to visit.   No Known Allergies  FH: Family History  Problem Relation Age of Onset  . Hypertension Mother   . Hypertension Father   . Stroke Father   . Hypertension Brother   . Dementia Maternal Grandfather   . Stroke Paternal Grandmother     SH: Social History   Socioeconomic History  . Marital status: Married    Spouse name: Not on file  . Number of children: Not on file  . Years of education: Not on file  . Highest education level: Not on file  Occupational History  . Not on file  Tobacco Use  .  Smoking status: Never Smoker  . Smokeless tobacco: Never Used  Substance and Sexual Activity  . Alcohol use: Never  . Drug use: Never  . Sexual activity: Not on file  Other Topics Concern  . Not on file  Social History Narrative   Married, 1 son.   Orig from [place], [place].   Lives in [place], [place].     Works in IT consultant.   No T/A/Ds.   Social Determinants of Health   Financial Resource Strain:   . Difficulty of Paying Living Expenses:   Food Insecurity:   . Worried About Programme researcher, broadcasting/film/video in the Last Year:   . Barista in the Last Year:   Transportation Needs:   . Freight forwarder (Medical):   Marland Kitchen Lack of Transportation (Non-Medical):   Physical Activity:   . Days of Exercise per Week:   . Minutes of Exercise per Session:   Stress:   . Feeling of Stress :   Social Connections:   . Frequency of Communication with Friends and Family:   . Frequency of Social Gatherings with Friends and Family:   . Attends Religious Services:   . Active Member of Clubs or Organizations:   . Attends Banker Meetings:   Marland Kitchen Marital Status:     ROS:  Review of Systems  Constitutional: Negative.   HENT: Negative.   Eyes: Negative.  Negative for blurred vision.  Respiratory: Negative.  Negative for shortness of breath.   Cardiovascular: Negative.  Negative for chest pain, palpitations and leg swelling.  Gastrointestinal: Negative.  Negative for constipation, diarrhea, nausea and vomiting.  Genitourinary: Positive for frequency. Negative for dysuria and urgency.  Musculoskeletal: Negative.  Negative for back pain, joint pain, myalgias and neck pain.  Skin: Negative.   Neurological: Negative.  Negative for dizziness, sensory change, weakness and headaches.  Endo/Heme/Allergies: Negative.   Psychiatric/Behavioral: Negative.     PE: Vitals with BMI 06/23/2019 06/23/2019 06/23/2019  Height - - 5\' 8"   Weight - - 220 lbs  BMI - - 33.46  Systolic 122  141 -  Diastolic 70 87 -  Pulse 80 88 -    Physical Exam  Constitutional: He is oriented to person, place, and time and well-developed, well-nourished, and in no distress. No distress.  HENT:  Head: Normocephalic and atraumatic.  Right Ear: External ear normal.  Left Ear: External ear normal.  Nose: Nose normal.  Mouth/Throat: Oropharynx is clear and moist. No oropharyngeal exudate.  Eyes: Pupils are equal, round, and reactive to light. Conjunctivae and EOM are normal. Right eye exhibits no discharge. Left eye exhibits no discharge. No scleral icterus.  Neck: No JVD present. No tracheal deviation present. No thyromegaly present.  Cardiovascular: Normal rate, regular rhythm, normal heart sounds and intact distal pulses. Exam reveals no gallop and no friction rub.  No murmur heard. Pulmonary/Chest: Effort normal and breath sounds normal. No stridor. No respiratory distress. He has no wheezes. He has no rales. He exhibits no tenderness.  Abdominal: Soft. Bowel sounds are normal. He exhibits no distension and no mass. There is no abdominal tenderness. There is no rebound and no guarding.  Musculoskeletal:        General: No edema.     Cervical back: Normal range of motion and neck supple.  Lymphadenopathy:    He has no cervical adenopathy.  Neurological: He is alert and oriented to person, place, and time.  Skin: Skin is warm and dry. No rash noted. He is not diaphoretic. No erythema. No pallor.  Psychiatric: Mood, memory, affect and judgment normal.    Labs: No results found for this or any previous visit (from the past 2160 hour(s)).   A/P: In summary, Mindy is a 44 year old man with a past medical history of HTN, atypical angina, and obesity who presents for his annual wellness visit. On physical exam, he is unremarkable. We reviewed age and gender appropriate health maintenance issues (prudent diet, regular exercise, health risks of tobacco and excessive alcohol, use of seatbelts,  fire alarms in home, use of sunscreen). Also reviewed age and gender appropriate health screening as well as vaccine recommendations. My plan is  HTN: continue taking hydrochlorothiazide 25mg  po qd  Labs: check a CBC, CMP, TSH, and FLP. Vaccines: Tdap UTD (next due by April 2031). Plans on getting COVID vaccine soon.  Prostate ca screening: average risk patient= as per latest guidelines, start screening at 22 yrs of age for african americans. Colon ca screening:  initial screening colonoscopy at 46 yo. [facility] is open to getting a referral to GI for colonoscopy.   COVID-19 Vaccine Information can be found at: 54 For questions related to vaccine distribution or appointments, please email [email or web address] or call [phone].      Lab Orders  No laboratory test(s) ordered today  Follow Up:  6 months   Signed: Bennie Dallas,  19 July 2019  I personally was present during the history, physical exam, and medical decision-making activities of this service and have verified that the service and findings are accurately documented in the student's note.  Signed:  Crissie Sickles, MD           07/19/2019

## 2019-07-19 NOTE — Progress Notes (Signed)
See med student note above. Signed:  Phil Meyer Arora, MD           07/19/2019  

## 2019-07-20 LAB — LIPID PANEL
Cholesterol: 171 mg/dL (ref <200)
HDL: 51 mg/dL (ref >=40)
LDL Cholesterol (Calc): 106 mg/dL (calc) — ABNORMAL HIGH
Non-HDL Cholesterol (Calc): 120 mg/dL (calc) (ref <130)
Total CHOL/HDL Ratio: 3.4 (calc) (ref <5.0)
Triglycerides: 58 mg/dL (ref <150)

## 2019-07-20 LAB — COMPREHENSIVE METABOLIC PANEL
AG Ratio: 1.6 (calc) (ref 1.0–2.5)
ALT: 25 U/L (ref 9–46)
AST: 27 U/L (ref 10–40)
Albumin: 4.4 g/dL (ref 3.6–5.1)
Alkaline phosphatase (APISO): 41 U/L (ref 36–130)
BUN/Creatinine Ratio: 21 (calc) (ref 6–22)
BUN: 28 mg/dL — ABNORMAL HIGH (ref 7–25)
CO2: 25 mmol/L (ref 20–32)
Calcium: 9.5 mg/dL (ref 8.6–10.3)
Chloride: 105 mmol/L (ref 98–110)
Creat: 1.32 mg/dL (ref 0.60–1.35)
Globulin: 2.8 g/dL (calc) (ref 1.9–3.7)
Glucose, Bld: 107 mg/dL — ABNORMAL HIGH (ref 65–99)
Potassium: 3.9 mmol/L (ref 3.5–5.3)
Sodium: 141 mmol/L (ref 135–146)
Total Bilirubin: 0.6 mg/dL (ref 0.2–1.2)
Total Protein: 7.2 g/dL (ref 6.1–8.1)

## 2019-07-20 LAB — CBC WITH DIFFERENTIAL/PLATELET
Absolute Monocytes: 360 cells/uL (ref 200–950)
Basophils Absolute: 29 cells/uL (ref 0–200)
Basophils Relative: 0.8 %
Eosinophils Absolute: 292 cells/uL (ref 15–500)
Eosinophils Relative: 8.1 %
HCT: 47.6 % (ref 38.5–50.0)
Hemoglobin: 15.8 g/dL (ref 13.2–17.1)
Lymphs Abs: 1836 cells/uL (ref 850–3900)
MCH: 29 pg (ref 27.0–33.0)
MCHC: 33.2 g/dL (ref 32.0–36.0)
MCV: 87.5 fL (ref 80.0–100.0)
MPV: 10.7 fL (ref 7.5–12.5)
Monocytes Relative: 10 %
Neutro Abs: 1084 cells/uL — ABNORMAL LOW (ref 1500–7800)
Neutrophils Relative %: 30.1 %
Platelets: 200 10*3/uL (ref 140–400)
RBC: 5.44 10*6/uL (ref 4.20–5.80)
RDW: 13 % (ref 11.0–15.0)
Total Lymphocyte: 51 %
WBC: 3.6 10*3/uL — ABNORMAL LOW (ref 3.8–10.8)

## 2019-07-20 LAB — TSH: TSH: 0.94 mIU/L (ref 0.40–4.50)

## 2019-08-02 ENCOUNTER — Encounter: Payer: Self-pay | Admitting: Family Medicine

## 2019-09-01 ENCOUNTER — Other Ambulatory Visit: Payer: Self-pay | Admitting: Family Medicine

## 2020-01-16 ENCOUNTER — Ambulatory Visit: Payer: 59 | Admitting: Family Medicine

## 2020-01-20 ENCOUNTER — Other Ambulatory Visit: Payer: Self-pay

## 2020-01-20 ENCOUNTER — Encounter: Payer: Self-pay | Admitting: Family Medicine

## 2020-01-20 ENCOUNTER — Ambulatory Visit: Payer: BC Managed Care – PPO | Admitting: Family Medicine

## 2020-01-20 VITALS — BP 131/76 | HR 108 | Temp 98.0°F | Ht 68.0 in | Wt 216.0 lb

## 2020-01-20 DIAGNOSIS — S76112A Strain of left quadriceps muscle, fascia and tendon, initial encounter: Secondary | ICD-10-CM

## 2020-01-20 MED ORDER — NAPROXEN 500 MG PO TABS: 500.0000 mg | ORAL_TABLET | Freq: Two times a day (BID) | ORAL | 0 refills | Status: DC

## 2020-01-20 MED ORDER — CYCLOBENZAPRINE HCL 5 MG PO TABS: 5.0000 mg | ORAL_TABLET | Freq: Three times a day (TID) | ORAL | 0 refills | Status: DC | PRN

## 2020-01-20 NOTE — Progress Notes (Signed)
This visit occurred during the SARS-CoV-2 public health emergency.  Safety protocols were in place, including screening questions prior to the visit, additional usage of staff PPE, and extensive cleaning of exam room while observing appropriate contact time as indicated for disinfecting solutions.    Gerald Gonzalez , 09/16/1975, 44 y.o., male MRN: [id number] Patient Care Team    Relationship Specialty Notifications Start End  McGowen, Maryjean Morn, MD PCP - General Family Medicine  11/08/18     Chief Complaint  Patient presents with  . Leg Pain    The incident occurred approx 2 days ago. The incident occurred at home when getting off ladder. The injury mechanism was a possible twisting injury. The pain is present in the left leg (upper). The quality of the pain is described as moderate aching. The pain has been constant since onset and gradually worsen. Pt denies loss of motion, numbness or tingling. The symptoms are aggravated by movement and certain positions. He has tried acetaminophen for the symptoms with moderate relief.      Subjective: Pt presents for an OV with complaints of left thigh pain of 2 days duration. He noticed the pain started after he was climbing a ladder replacing ceiling fans at home. He does not recall a particular injury. He since endorses aching in the anterior thigh, which he reports is worsening over the last day. He has tried tylenol for relief that helped for a short period of time. He denies redness, swelling, fever or chills. Pain is worsened with twisting/pivet motion or stepping up. He denies numbness or tingling.   Depression screen [facility] 2/9 01/20/2020 11/09/2018  Decreased Interest 0 0  Down, Depressed, Hopeless 0 0  PHQ - 2 Score 0 0    No Known Allergies Social History   Social History Narrative   Married, 1 son.   Orig from [place], [place].   Lives in [place], [place].     Works in IT consultant.   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   Past Medical  History:  Diagnosis Date  . Atypical chest pain   . Hypertension   . Obesity, Class I, BMI 30-34.9    History reviewed. No pertinent surgical history. Family History  Problem Relation Age of Onset  . Hypertension Mother   . Hypertension Father   . Stroke Father   . Hypertension Brother   . Dementia Maternal Grandfather   . Stroke Paternal Grandmother    Allergies as of 01/20/2020   No Known Allergies     Medication List       Accurate as of January 20, 2020 11:35 AM. If you have any questions, ask your nurse or doctor.        hydrochlorothiazide 25 MG tablet Commonly known as: HYDRODIURIL TAKE 1 TABLET BY MOUTH EVERY DAY       All past medical history, surgical history, allergies, family history, immunizations andmedications were updated in the EMR today and reviewed under the history and medication portions of their EMR.     ROS: Negative, with the exception of above mentioned in HPI   Objective:  BP 131/76   Pulse (!) 108   Temp 98 F (36.7 C) (Oral)   Ht 5\' 8"  (1.727 m)   Wt 216 lb (98 kg)   SpO2 100%   BMI 32.84 kg/m  Body mass index is 32.84 kg/m. Gen: Afebrile. No acute distress. Nontoxic in appearance, well developed, well nourished.  HENT: AT. [place].  Eyes:Pupils Equal  Round Reactive to light, Extraocular movements intact,  Conjunctiva without redness, discharge or icterus. MSK: no erythema, no obvious soft tissue swelling. No masses. No bruising. TTP left anterior proximal  thigh and near iliac crest.  Skin: no rashes, purpura or petechiae.  Neuro: difficulty stepping up on table and walking with a mild limp.  t. Muscle strength 5/5 BLE with pain on hip flexion. NV intact distally.  Psych: Normal affect, dress and demeanor. Normal speech. Normal thought content and judgment.  No exam data present No results found. No results found for this or any previous visit (from the past 24 hour(s)).  Assessment/Plan: [name] is a 44 y.o. male  present for OV for  Strain of left quadriceps, initial encounter Upper quad/hip flexor strain most likely without obvious event or injury. Rest, heat application Naproxen BID with food Flexeril tid prn- sedation precaution given.  Can start light stretches after 3-5 days if seeing improving. Use pain as guide.  Work excuse provided today through Saturday.  F/u PCP in 1 weeks, sooner if worsening.    Reviewed expectations re: course of current medical issues.  Discussed self-management of symptoms.  Outlined signs and symptoms indicating need for more acute intervention.  Patient verbalized understanding and all questions were answered.  Patient received an After-Visit Summary.    No orders of the defined types were placed in this encounter.  No orders of the defined types were placed in this encounter.  Referral Orders  No referral(s) requested today     Note is dictated utilizing voice recognition software. Although note has been proof read prior to signing, occasional typographical errors still can be missed. If any questions arise, please do not hesitate to call for verification.   electronically signed by:  Felix Pacini, DO  [facility] [place]

## 2020-01-20 NOTE — Patient Instructions (Addendum)
naproxen every 12 hours for 7 days with food.   Quadriceps Strain  A quadriceps strain is an injury to the muscles or tendons on the front of the thigh. The quadriceps muscles are used in straightening the knee and bending the hip. A strain occurs when the muscle is overstretched or overloaded. There are three types of strains:  Grade 1 is a mild strain. It involves a stretching or minor tearing of your muscle fibers or tendons. You should have little, if any, trouble using your thigh.  Grade 2 is a moderate strain. It involves a partial tearing of your muscle fibers or tendons. You will have pain and some loss of strength in your thigh.  Grade 3 is a severe strain. It involves a complete tearing of your muscle fibers or tendons. It causes severe pain and loss of strength in your thigh. Recovery will take a few weeks or longer, depending on how bad your strain is. What are the causes? This injury is caused by overextending the muscles in the thigh. What increases the risk? The following factors may make you more likely to develop this injury:  Participating in: ? Activities that involve jumping, sprinting, or sudden twisting. ? Contact sports, such as football or soccer.  Having a previous injury to your thigh or knee.  Having poor strength and flexibility.  Not warming up properly before activity.  Having one leg that is much stronger than the other.  Exercising to the point of exhaustion. What are the signs or symptoms? Symptoms of this condition include:  Sudden, severe pain in your thigh.  Pain and tenderness over your quadriceps muscles. The pain gets worse when you use these muscles.  Muscle spasm in your thigh.  Swelling in your thigh.  Bruising.  Having trouble with tasks that involve using your quadriceps muscle, such as walking.  A crackling sound when the tendon is moved or touched. How is this diagnosed? This condition is diagnosed based on:  A physical  exam.  Your medical history.  Imaging tests, such as: ? X-rays. ? Ultrasound. ? MRI. How is this treated? Treatment for this condition may include:  Resting your leg and avoiding activities that cause pain.  Taking medicine to help reduce pain and inflammation.  Applying ice to the area to relieve swelling and inflammation.  Elevating the leg to reduce or prevent swelling.  Applying a compression wrap to the muscle.  Using crutches until you can walk without pain.  Working with a physical therapist on exercises to restore strength and flexibility in your thigh. In rare cases, surgery may be needed. Follow these instructions at home: Managing pain, stiffness, and swelling   If directed, put ice on the injured area. ? Put ice in a plastic bag. ? Place a towel between your skin and the bag. ? Leave the ice on for 20 minutes, 2-3 times a day.  Raise (elevate) the injured area above the level of your heart while you are sitting or lying down. Activity  Do not use the injured leg to support your body weight until your health care provider says that you can. Use crutches as told by your health care provider.  Do exercises as told by your health care provider.  Return to your normal activities as told by your health care provider. Ask your health care provider what activities are safe for you. General instructions  Take over-the-counter and prescription medicines only as told by your health care provider.  Use compression  wraps to apply pressure as told by your health care provider.  Keep all follow-up visits as told by your health care provider. This is important. How is this prevented?  Warm up and stretch before being active.  Cool down and stretch after being active.  Give your body time to rest between periods of activity.  Maintain physical fitness, including: ? Strength. ? Flexibility.  Be safe and responsible while being active. This will help you to avoid  falls.  Do at least 150 minutes of moderate-intensity exercise each week, such as brisk walking or water aerobics. Contact a health care provider if:  Your pain, bruising, or tenderness gets worse, even with treatment.  Your leg becomes weaker. Summary  A quadriceps strain is an injury to the muscles or tendons on the front of the thigh.  This injury is caused by overextending the muscles in the thigh.  Treatment may include rest, ice, medicines, and physical therapy. In rare cases, surgery may be needed. This information is not intended to replace advice given to you by your health care provider. Make sure you discuss any questions you have with your health care provider. Document Revised: 06/29/2018 Document Reviewed: 02/01/2018 Elsevier Patient Education  2020 [employer]    Quadriceps Strain Rehab Ask your health care provider which exercises are safe for you. Do exercises exactly as told by your health care provider and adjust them as directed. It is normal to feel mild stretching, pulling, tightness, or discomfort as you do these exercises. Stop right away if you feel sudden pain or your pain gets worse. Do not begin these exercises until told by your health care provider. Stretching and range-of-motion exercises These exercises warm up your muscles and joints and improve the movement and flexibility of your thigh. These exercises can also help to relieve stiffness or swelling. Heel slides  1. Lie on your back with both legs straight. If this causes back discomfort, bend the knee of your healthy leg so your foot is flat on the floor. 2. Slowly slide your left / right heel back toward your buttocks. Stop when you feel a gentle stretch in the front of your knee or thigh (quadriceps). 3. Hold this position for __________ seconds. 4. Slowly slide your left / right heel back to the starting position. Repeat __________ times. Complete this exercise __________ times a  day. Quadriceps stretch, prone  1. Lie on your abdomen on a firm surface, such as a bed or padded floor (prone position). 2. Bend your left / right knee and hold your ankle. If you cannot reach your ankle or pant leg, loop a belt around your foot and grab the belt instead. 3. Gently pull your heel toward your buttocks. Your knee should not slide out to the side. You should feel a stretch in the front of your thigh and knee (quadriceps). 4. Hold this position for __________ seconds. Repeat __________ times. Complete this exercise __________ times a day. Strengthening exercises These exercises build strength and endurance in your thigh. Endurance is the ability to use your muscles for a long time, even after your muscles get tired. Straight leg raises, supine This exercise stretches the muscles in front of your thigh (quadriceps) and the muscles that move your hips (hip flexors). Quality counts! Watch for signs that the quadriceps muscle is working to ensure that you are strengthening the correct muscles and not cheating by using healthier muscles. 1. Lie on your back (supine position) with your left / right  leg extended and your other knee bent. 2. Tense the muscles in the front of your left / right thigh. You should see your kneecap slide up or see increased dimpling just above the knee. 3. Tighten these muscles even more and raise your leg 4-6 inches (10-15 cm) off the floor. 4. Hold this position for __________ seconds. 5. Keep the thigh muscles tense as you lower your leg. 6. Relax the muscles slowly and completely after each repetition. Repeat __________ times. Complete this exercise __________ times a day. Leg raises, prone This exercise strengthens the muscles that move the hips (hip extensors). 1. Lie on your abdomen on a bed or a firm surface (prone position). Place a pillow under your hips. 2. Bend your left / right knee so your foot is straight up in the air. 3. Squeeze your  buttocks muscles and lift your left / right thigh off the bed. Do not let your back arch. 4. Hold this position for __________ seconds. 5. Slowly return to the starting position. Let your muscles relax completely before doing another repetition. Repeat __________ times. Complete this exercise __________ times a day. Wall sits Follow the directions for form closely. Knee pain can occur if your feet or knees are not placed properly. 1. Lean your back against a smooth wall or door, and walk your feet out 18-24 inches (46-61 cm) from it. 2. Place your feet hip-width apart. 3. Slowly slide down the wall or door until your knees bend __________ degrees. Keep your weight back and over your heels, not over your toes. Keep your thighs straight or pointing slightly outward. 4. Hold this position for __________ seconds. 5. Use your thigh and buttocks muscles to push yourself back up to a standing position. Keep your weight through your heels while you do this. 6. Rest for __________ seconds after each repetition. Repeat __________ times. Complete this exercise __________ times a day. This information is not intended to replace advice given to you by your health care provider. Make sure you discuss any questions you have with your health care provider. Document Revised: 06/29/2018 Document Reviewed: 12/28/2017 Elsevier Patient Education  2020 [employer].

## 2020-01-24 ENCOUNTER — Ambulatory Visit: Payer: BC Managed Care – PPO | Admitting: Family Medicine

## 2020-01-24 ENCOUNTER — Other Ambulatory Visit: Payer: Self-pay

## 2020-01-24 ENCOUNTER — Encounter: Payer: Self-pay | Admitting: Family Medicine

## 2020-01-24 VITALS — BP 149/80 | HR 103 | Temp 97.6°F | Resp 16 | Ht 68.0 in | Wt 216.0 lb

## 2020-01-24 DIAGNOSIS — I10 Essential (primary) hypertension: Secondary | ICD-10-CM | POA: Diagnosis not present

## 2020-01-24 MED ORDER — METOPROLOL SUCCINATE ER 25 MG PO TB24: 25.0000 mg | ORAL_TABLET | Freq: Every day | ORAL | 0 refills | Status: DC

## 2020-01-24 MED ORDER — HYDROCHLOROTHIAZIDE 25 MG PO TABS: 25.0000 mg | ORAL_TABLET | Freq: Every day | ORAL | 3 refills | Status: DC

## 2020-01-24 NOTE — Progress Notes (Signed)
OFFICE VISIT  01/24/2020  CC:  Chief Complaint  Patient presents with  . Follow-up    hypertension, pt is     HPI:    Patient is a 44 y.o. male who presents for 6 mo f/u HTN. A/P as of last visit: "In summary, Savannah is a 44 year old man with a past medical history of HTN, atypical angina, and obesity who presents for his annual wellness visit. On physical exam, he is unremarkable. We reviewed age and gender appropriate health maintenance issues (prudent diet, regular exercise, health risks of tobacco and excessive alcohol, use of seatbelts, fire alarms in home, use of sunscreen). Also reviewed age and gender appropriate health screening as well as vaccine recommendations. My plan is  HTN: continue taking hydrochlorothiazide 25mg  po qd  Labs: check a CBC, CMP, TSH, and FLP. Vaccines:Tdap UTD (next due by April 2031). Plans on getting COVID vaccine soon.  Prostate ca screening:average risk patient= as per latest guidelines, start screening at 57 yrs of age for african americans. Colon ca screening: initial screening colonoscopy at 46 yo. Eliyas is open to getting a referral to GI for colonoscopy"  INTERIM HX: Feeling well. Taking hctz qd, no problems. Checks bp about twice per month and gets <130/80. Still working out regularly. Recent quad strain was resolved the day after he saw Dr. Dorene Sorrow.  He has not taken any aleve or flexeril today.  Past Medical History:  Diagnosis Date  . Atypical chest pain   . Hypertension   . Obesity, Class I, BMI 30-34.9     History reviewed. No pertinent surgical history.  Outpatient Medications Prior to Visit  Medication Sig Dispense Refill  . hydrochlorothiazide (HYDRODIURIL) 25 MG tablet TAKE 1 TABLET BY MOUTH EVERY DAY 30 tablet 4  . cyclobenzaprine (FLEXERIL) 5 MG tablet Take 1 tablet (5 mg total) by mouth 3 (three) times daily as needed for muscle spasms. (Patient not taking: Reported on 01/24/2020) 90 tablet 0  . naproxen (NAPROSYN) 500 MG  tablet Take 1 tablet (500 mg total) by mouth 2 (two) times daily with a meal. With food (Patient not taking: Reported on 01/24/2020) 30 tablet 0   No facility-administered medications prior to visit.    No Known Allergies  ROS As per HPI  PE: Vitals with BMI 01/24/2020 01/20/2020 07/19/2019  Height 5\' 8"  5\' 8"  5\' 9"   Weight 216 lbs 216 lbs 215 lbs 13 oz  BMI 32.85 32.85 31.85  Systolic 149 131 07/21/2019  Diastolic 80 76 88  Pulse 103 108 85  Manual bp recheck at end of visit is 162/110.  Gen: Alert, well appearing.  Patient is oriented to person, place, time, and situation. AFFECT: pleasant, lucid thought and speech. CV: Regular, mildly tachy (around 100/min). No m/r/g. EXT: no clubbing or cyanosis.  no edema.    LABS:  Lab Results  Component Value Date   TSH 0.94 07/19/2019   Lab Results  Component Value Date   WBC 3.6 (L) 07/19/2019   HGB 15.8 07/19/2019   HCT 47.6 07/19/2019   MCV 87.5 07/19/2019   PLT 200 07/19/2019   Lab Results  Component Value Date   CREATININE 1.32 07/19/2019   BUN 28 (H) 07/19/2019   NA 141 07/19/2019   K 3.9 07/19/2019   CL 105 07/19/2019   CO2 25 07/19/2019   Lab Results  Component Value Date   ALT 25 07/19/2019   AST 27 07/19/2019   BILITOT 0.6 07/19/2019   Lab Results  Component Value Date   CHOL 171 07/19/2019   Lab Results  Component Value Date   HDL 51 07/19/2019   Lab Results  Component Value Date   LDLCALC 106 (H) 07/19/2019   Lab Results  Component Value Date   TRIG 58 07/19/2019   Lab Results  Component Value Date   CHOLHDL 3.4 07/19/2019    IMPRESSION AND PLAN:  HTN, not well controlled (but not a lot of data to go on, either).   Resting HR has been ULN here a few times. Continue hctz 25mg  qd, rx sent in today. Add toprol xl 25mg  qd. Obtain 24 ambulatory bp monitoring--ordered today. Continue low Na diet, lots of CV exercise, avoid taking any NSAIDs  Check bp and hr bid and write numbers down to review  with me in 1 wk.  An After Visit Summary was printed and given to the patient.  FOLLOW UP: Return in about 1 week (around 01/31/2020) for f/u HTN.  Signed:  , MD           01/24/2020

## 2020-02-03 ENCOUNTER — Ambulatory Visit: Payer: BC Managed Care – PPO | Admitting: Family Medicine

## 2020-02-05 ENCOUNTER — Telehealth: Payer: Self-pay | Admitting: *Deleted

## 2020-02-05 NOTE — Telephone Encounter (Signed)
Attempt to contact patient to schedule 24 hour ambulatory blood pressure monitor. No answer, voice mail not set up.

## 2020-02-05 NOTE — Telephone Encounter (Signed)
Micky returning [facility] phone call, connected call to [place].

## 2020-02-10 ENCOUNTER — Ambulatory Visit: Payer: BC Managed Care – PPO

## 2020-02-10 ENCOUNTER — Ambulatory Visit: Payer: BC Managed Care – PPO | Admitting: Family Medicine

## 2020-02-10 ENCOUNTER — Telehealth: Payer: Self-pay | Admitting: *Deleted

## 2020-02-10 ENCOUNTER — Encounter: Payer: Self-pay | Admitting: Family Medicine

## 2020-02-10 ENCOUNTER — Other Ambulatory Visit: Payer: Self-pay

## 2020-02-10 VITALS — BP 126/86 | HR 122 | Temp 98.0°F | Resp 16 | Ht 68.0 in | Wt 217.4 lb

## 2020-02-10 DIAGNOSIS — I10 Essential (primary) hypertension: Secondary | ICD-10-CM | POA: Diagnosis not present

## 2020-02-10 DIAGNOSIS — R000 Tachycardia, unspecified: Secondary | ICD-10-CM

## 2020-02-10 NOTE — Telephone Encounter (Signed)
Patient calling to reschedule monitor appt. Please call

## 2020-02-10 NOTE — Telephone Encounter (Signed)
Patient going out of town on business.  He will not be able to return monitor until Christmas.  Reschedule 24 hour ambulatory blood pressure monitor to Monday, 03/16/2020.

## 2020-02-10 NOTE — Progress Notes (Signed)
See student note from same date. I personally was present during the history, physical exam, and medical decision-making activities of this service and have verified that the service and findings are accurately documented in the student's note.  Pt asymptomatic, feeling well. Encouraged him to start toprol xl and hopefully this will get bp to goal and lower resting HR. Will get Holter monitor in near future if tachycardia remains an issue but will focus on just trying to get the 24H amb bp monitoring at this time. Signed:  Santiago Bumpers, MD           02/10/2020

## 2020-02-10 NOTE — Progress Notes (Signed)
CC: Follow up HTN  HPI:  Gerald Gonzalez is a 44 yo male who presents to the clinic today for a one-week follow up for uncontrolled HTN. Patient was seen in the clinic on 01/24/2020. His BP at that time in clinic was 149/80. Patient did not have a log of at home BP readings that could provide insight into his baseline BP. At last visit patient was instructed to keep taking his hydrochlorothiazide 25 mg qd, and toprol 25 mg xl qd was added to his medications for BP. 24 hr BP monitoring was ord ered and patient was also instructed to measure BP BID at home and bring recordings in to next visit.   Today, patient reports that he has been unable to obtain 24 hr BP monitor secondary to his job requiring him to work a considerable distance from here. Patient reports that his home BP readings average in the 130-135 systolic. With occasional BP in the 140s systolic occurring approximately twice per week. Patient has yet to start the toprol xl 25 mg prescribed last visit as he is unsure how it will make him feel at his work. Patient denies and SOB, palpitations, chest pain, dizziness, or light-headedness.   PMH: Past Medical History:  Diagnosis Date  . Atypical chest pain   . Hypertension   . Obesity, Class I, BMI 30-34.9     M/A: Current Outpatient Medications on File Prior to Visit  Medication Sig Dispense Refill  . cyclobenzaprine (FLEXERIL) 5 MG tablet Take 1 tablet (5 mg total) by mouth 3 (three) times daily as needed for muscle spasms. (Patient not taking: Reported on 01/24/2020) 90 tablet 0  . hydrochlorothiazide (HYDRODIURIL) 25 MG tablet Take 1 tablet (25 mg total) by mouth daily. 90 tablet 3  . metoprolol succinate (TOPROL-XL) 25 MG 24 hr tablet Take 1 tablet (25 mg total) by mouth daily. 30 tablet 0  . naproxen (NAPROSYN) 500 MG tablet Take 1 tablet (500 mg total) by mouth 2 (two) times daily with a meal. With food (Patient not taking: Reported on 01/24/2020) 30 tablet 0   No current  facility-administered medications on file prior to visit.   No Known Allergies  FH: Family History  Problem Relation Age of Onset  . Hypertension Mother   . Hypertension Father   . Stroke Father   . Hypertension Brother   . Dementia Maternal Grandfather   . Stroke Paternal Grandmother     SH: Social History   Socioeconomic History  . Marital status: Married    Spouse name: Not on file  . Number of children: Not on file  . Years of education: Not on file  . Highest education level: Not on file  Occupational History  . Not on file  Tobacco Use  . Smoking status: Never Smoker  . Smokeless tobacco: Never Used  Vaping Use  . Vaping Use: Never used  Substance and Sexual Activity  . Alcohol use: Never  . Drug use: Never  . Sexual activity: Not on file  Other Topics Concern  . Not on file  Social History Narrative   Married, 1 son.   Orig from [place], [place].   Lives in [place], [place].     Works in IT consultant.   No T/A/Ds.   Social Determinants of Health   Financial Resource Strain:   . Difficulty of Paying Living Expenses: Not on file  Food Insecurity:   . Worried About Programme researcher, broadcasting/film/video in the Last Year: Not  on file  . Ran Out of Food in the Last Year: Not on file  Transportation Needs:   . Lack of Transportation (Medical): Not on file  . Lack of Transportation (Non-Medical): Not on file  Physical Activity:   . Days of Exercise per Week: Not on file  . Minutes of Exercise per Session: Not on file  Stress:   . Feeling of Stress : Not on file  Social Connections:   . Frequency of Communication with Friends and Family: Not on file  . Frequency of Social Gatherings with Friends and Family: Not on file  . Attends Religious Services: Not on file  . Active Member of Clubs or Organizations: Not on file  . Attends Banker Meetings: Not on file  . Marital Status: Not on file    ROS: Review of Systems  Constitutional: Negative for  chills, diaphoresis, fever and weight loss.  Eyes: Negative for blurred vision.  Respiratory: Negative for shortness of breath.   Cardiovascular: Negative for chest pain and palpitations.  Neurological: Negative for dizziness, loss of consciousness and headaches.    PE: Vitals with BMI 01/24/2020 01/20/2020 07/19/2019  Height 5\' 8"  5\' 8"  5\' 9"   Weight 216 lbs 216 lbs 215 lbs 13 oz  BMI 32.85 32.85 31.85  Systolic 149 131  Diastolic 80 76 88  Pulse 103 108 85    Physical Exam Vitals reviewed.  Constitutional:      General: He is not in acute distress.    Appearance: Normal appearance.  HENT:     Head: Normocephalic and atraumatic.     Mouth/Throat:     Mouth: Mucous membranes are moist.  Eyes:     Extraocular Movements: Extraocular movements intact.  Cardiovascular:     Rate and Rhythm: Regular rhythm. Tachycardia present.     Pulses: Normal pulses.     Heart sounds: Normal heart sounds. No murmur heard.  No friction rub. No gallop.   Pulmonary:     Effort: Pulmonary effort is normal.     Breath sounds: Normal breath sounds.  Musculoskeletal:     Cervical back: Normal range of motion.  Skin:    General: Skin is warm and dry.  Neurological:     General: No focal deficit present.     Mental Status: He is alert and oriented to person, place, and time.  Psychiatric:        Mood and Affect: Mood normal.        Behavior: Behavior normal.     Labs: No results found for this or any previous visit (from the past 2160 hour(s)).   A/P: In summary, Gerald Gonzalez is a 44 y.o. year old male presents to the clinic today for a one-week follow up for uncontrolled HTN.  1) HTN: BP today 126/86. Patient's at home BP readings averaging 130-135 systolic. Has not done 24 hr ambulatory BP monitor secondary to work requirements. Patient stated he will be able to do 24 hr monitor the week of Christmas, will plan for the monitor for that time. - Continue hctz 25 mg qd - Continue  low Na diet, CV exercise, avoid taking NSAIDs - Continue to monitor BP at home  2) Sinus Tachycardia of unknown significance: Patient's heart rate this visit 122. Patient has had series of tachycardia in previous 3 visits. Toprol xl 25 mg qd was added to treatment regimen last visit but patient has not taken as he was uncertain how it would make him  feel during work. 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 Patient not currently experiencing any palpitations, SOB, dizziness, light-headedness, chest pain that would be overtly concerning for pathology. Will reassess in 6 weeks after patient has started Toprol. - Start Toprol xl 25 mg qd   Lab Orders  No laboratory test(s) ordered today     Follow Up:  6 weeks for HTN and sinus tachycardia Signed: Sol Blazing, MS3

## 2020-06-08 ENCOUNTER — Encounter: Payer: Self-pay | Admitting: Family Medicine

## 2020-06-08 ENCOUNTER — Other Ambulatory Visit: Payer: Self-pay

## 2020-06-08 ENCOUNTER — Ambulatory Visit: Payer: BC Managed Care – PPO | Admitting: Family Medicine

## 2020-06-08 VITALS — BP 122/82 | HR 99 | Temp 98.2°F | Resp 16 | Ht 68.0 in | Wt 214.8 lb

## 2020-06-08 DIAGNOSIS — R000 Tachycardia, unspecified: Secondary | ICD-10-CM | POA: Diagnosis not present

## 2020-06-08 DIAGNOSIS — I10 Essential (primary) hypertension: Secondary | ICD-10-CM

## 2020-06-08 LAB — BASIC METABOLIC PANEL
BUN: 20 mg/dL (ref 6–23)
CO2: 27 mEq/L (ref 19–32)
Calcium: 9.7 mg/dL (ref 8.4–10.5)
Chloride: 99 mEq/L (ref 96–112)
Creatinine, Ser: 1.27 mg/dL (ref 0.40–1.50)
GFR: 67.73 mL/min (ref >60.00)
Glucose, Bld: 100 mg/dL — ABNORMAL HIGH (ref 70–99)
Potassium: 3.5 mEq/L (ref 3.5–5.1)
Sodium: 137 mEq/L (ref 135–145)

## 2020-06-08 MED ORDER — METOPROLOL SUCCINATE ER 25 MG PO TB24: 25.0000 mg | ORAL_TABLET | Freq: Every day | ORAL | 3 refills | Status: DC

## 2020-06-08 NOTE — Progress Notes (Signed)
OFFICE VISIT  06/08/2020  CC:  Chief Complaint  Patient presents with  . Follow-up    Hypertension, tachycardia. Pt is not fasting    HPI:    Patient is a 45 y.o. African-American male who presents for 4 mo f/u HTN and tachycardia. A/P as of last visit: "1) HTN: BP today 126/86. Patient's at home BP readings averaging 130-135 systolic. Has not done 24 hr ambulatory BP monitor secondary to work requirements. Patient stated he will be able to do 24 hr monitor the week of Christmas, will plan for the monitor for that time. - Continue hctz 25 mg qd - Continue low Na diet, CV exercise, avoid taking NSAIDs - Continue to monitor BP at home  2) Sinus Tachycardia of unknown significance: Patient's heart rate this visit 122. Patient has had series of tachycardia in previous 3 visits. Toprol xl 25 mg qd was added to treatment regimen last visit but patient has not taken as he was uncertain how it would make him feel during work. 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 Patient not currently experiencing any palpitations, SOB, dizziness, light-headedness, chest pain that would be overtly concerning for pathology. Will reassess in 6 weeks after patient has started Toprol. - Start Toprol xl 25 mg qd"  INTERIM HX: Still hasn't gotten 24H amb bp monitoring. He's busy with work, travels a lot, Catering manager. Has been taking the metop qod b/c his abundance of caution when it comes to taking meds,.  He assoc taking toprol with a feeling of a sensation of lower HR briefly.   Says bp normal whether he takes it or not.  He took the metoprolol today and HR here today is 99. He's trying to make some healthier dietary choices. Still very physically active.   Past Medical History:  Diagnosis Date  . Atypical chest pain   . Hypertension   . Obesity, Class I, BMI 30-34.9     History reviewed. No pertinent surgical history.  Outpatient  Medications Prior to Visit  Medication Sig Dispense Refill  . hydrochlorothiazide (HYDRODIURIL) 25 MG tablet Take 1 tablet (25 mg total) by mouth daily. 90 tablet 3  . cyclobenzaprine (FLEXERIL) 5 MG tablet Take 1 tablet (5 mg total) by mouth 3 (three) times daily as needed for muscle spasms. 90 tablet 0  . metoprolol succinate (TOPROL-XL) 25 MG 24 hr tablet Take 1 tablet (25 mg total) by mouth daily. 30 tablet 0  . naproxen (NAPROSYN) 500 MG tablet Take 1 tablet (500 mg total) by mouth 2 (two) times daily with a meal. With food (Patient not taking: No sig reported) 30 tablet 0   No facility-administered medications prior to visit.    No Known Allergies  ROS As per HPI  PE: Vitals with BMI 06/08/2020 02/10/2020 01/24/2020  Height 5\' 8"  5\' 8"  5\' 8"   Weight 214 lbs 13 oz 217 lbs 6 oz 216 lbs  BMI 32.67 33.06 32.85  Systolic 122 126  Diastolic 82 86 80  Pulse 99 122 103    Gen: Alert, well appearing.  Patient is oriented to person, place, time, and situation. AFFECT: pleasant, lucid thought and speech. CV: RRR (rate 85 on my exam), no m/r/g.   LUNGS: CTA bilat, nonlabored resps, good aeration in all lung fields. EXT: no clubbing or cyanosis.  no edema.    LABS:    Chemistry      Component Value Date/Time   NA  141 07/19/2019 0853   K 3.9 07/19/2019 0853   CL 105 07/19/2019 0853   CO2 25 07/19/2019 0853   BUN 28 (H) 07/19/2019 0853   CREATININE 1.32 07/19/2019 0853      Component Value Date/Time   CALCIUM 9.5 07/19/2019 0853   AST 27 07/19/2019 0853   ALT 25 07/19/2019 0853   BILITOT 0.6 07/19/2019 0853     Lab Results  Component Value Date   CHOL 171 07/19/2019   HDL 51 07/19/2019   LDLCALC 106 (H) 07/19/2019   TRIG 58 07/19/2019   CHOLHDL 3.4 07/19/2019   Lab Results  Component Value Date   WBC 3.6 (L) 07/19/2019   HGB 15.8 07/19/2019   HCT 47.6 07/19/2019   MCV 87.5 07/19/2019   PLT 200 07/19/2019   Lab Results  Component Value Date   TSH 0.94  07/19/2019   IMPRESSION AND PLAN:  1) HTN: adequately controlled on hctz 25mg  qd and toprol xl 25mg  qd. Lytes/cr today. Encouraged him to still pursue the 24H ambulatory bp monitor but he's so far not had any luck b/c of high demand for this monitor and his busy (out of town) work schedule.  2) Sinus tachycardia: picked up when here in office visits, no sx's and pt has no outside HR data to add to this. Brief sensation of feeling HR slow down on days he has taken his toprol but no dizziness or fatigue and he says the sensation is brief and doesn't bother him much. I told him to go ahead and start taking the med every day now instead of every other day.  An After Visit Summary was printed and given to the patient.  FOLLOW UP: Return in about 6 months (around 12/09/2020) for annual CPE (fasting).  Signed:  , MD           06/08/2020

## 2020-06-25 ENCOUNTER — Other Ambulatory Visit: Payer: Self-pay

## 2020-06-26 ENCOUNTER — Ambulatory Visit: Payer: BC Managed Care – PPO | Admitting: Family Medicine

## 2020-11-08 IMAGING — CR DG CHEST 2V
2 series · 2 of 2 positions shown · non-contrast
Comparison: 05/09/2003

CLINICAL DATA: Chest pain

EXAM:
CHEST - 2 VIEW

[w chest pa]
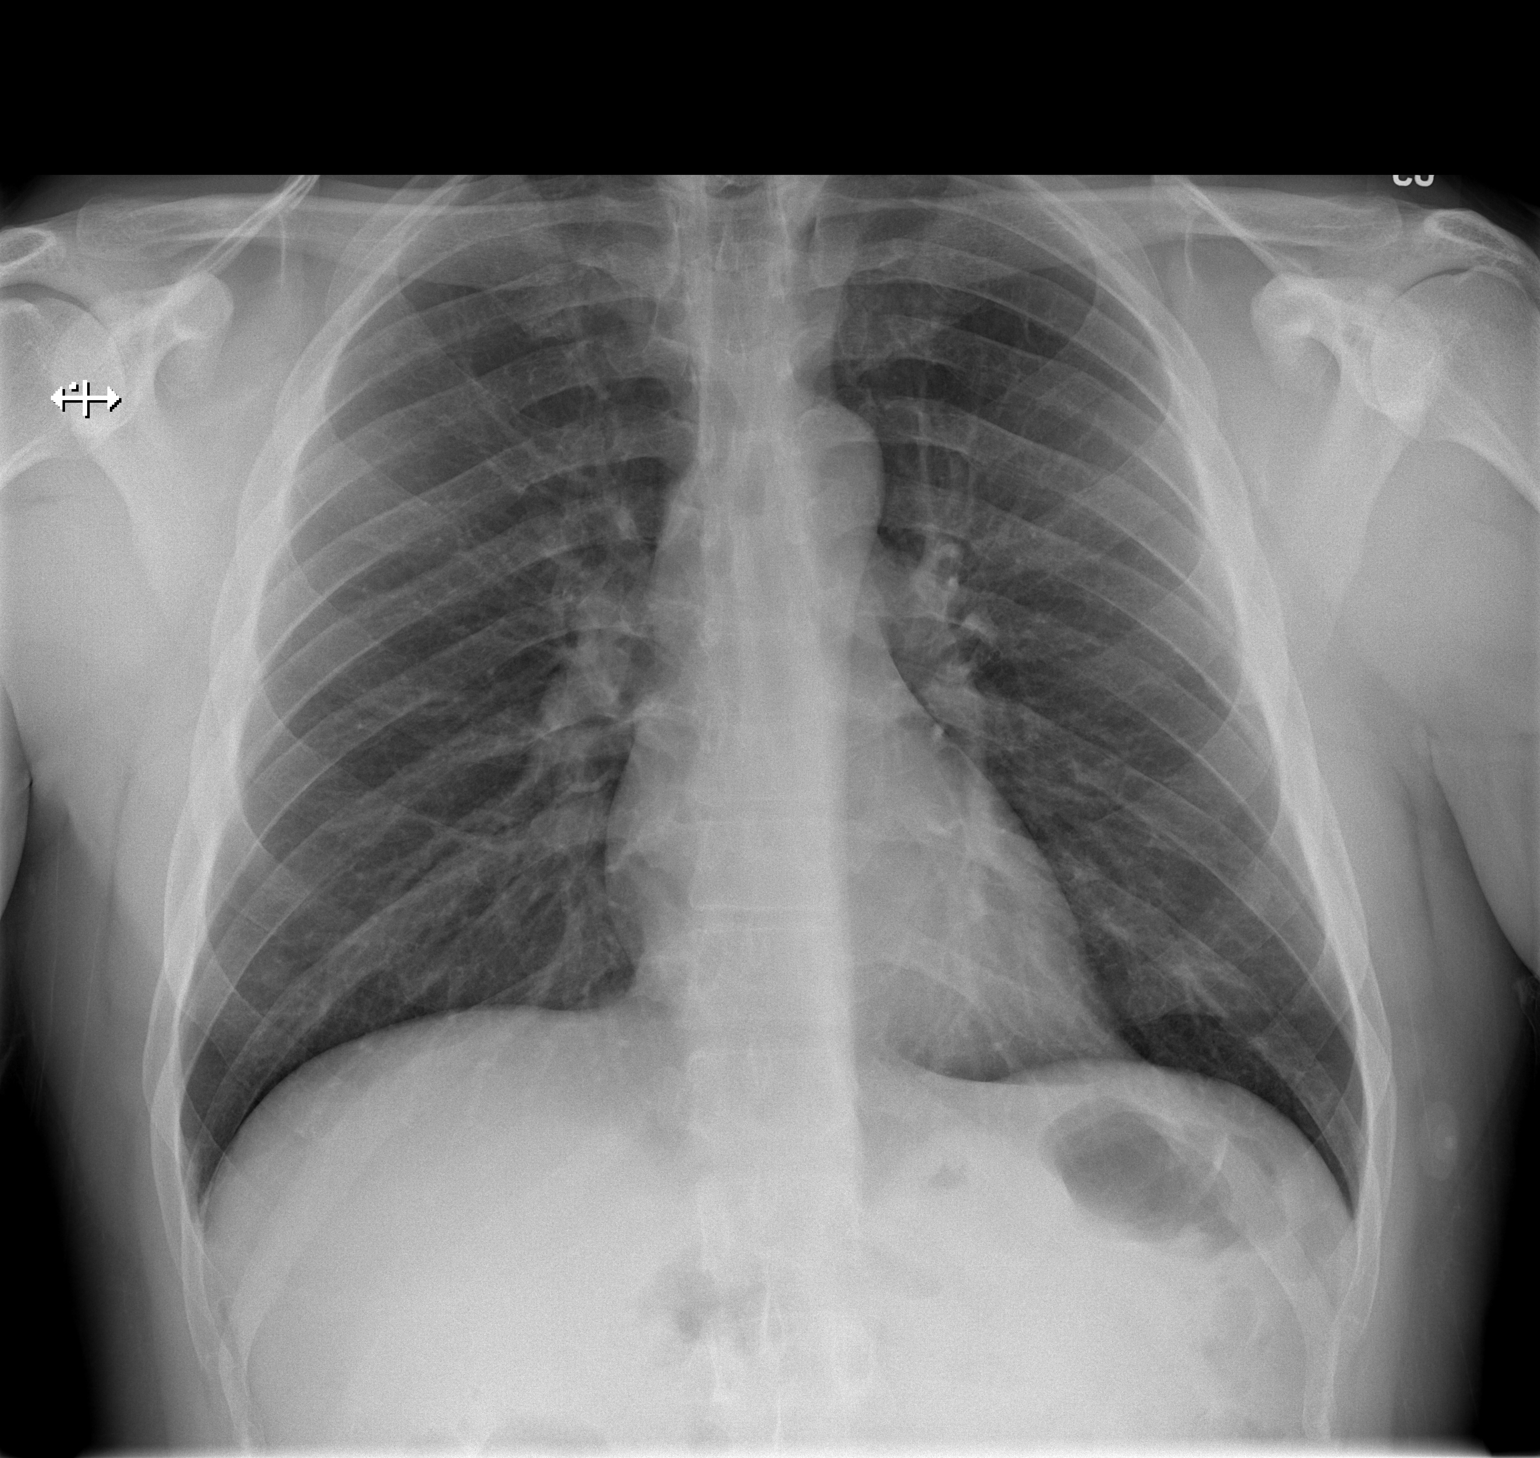

[w chest lat]
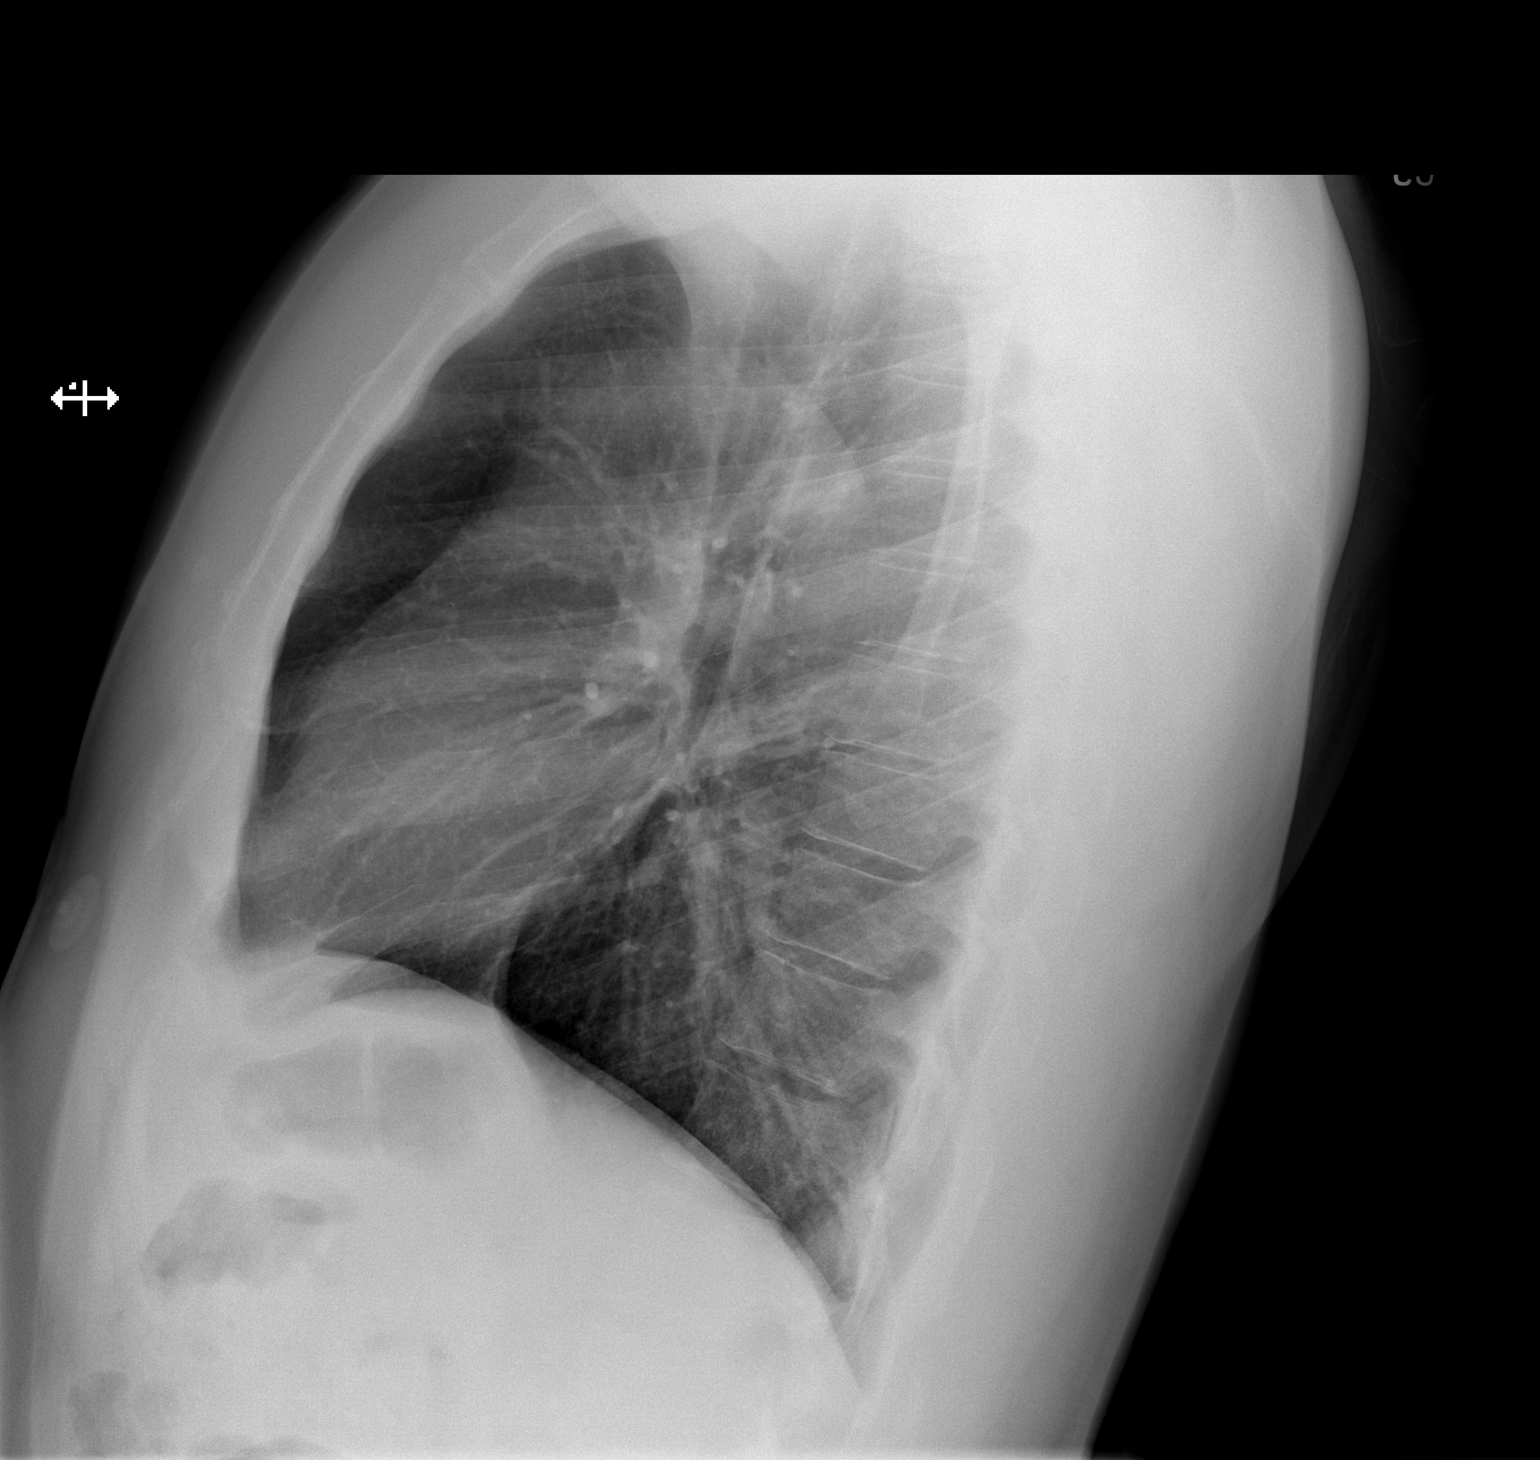

[2 of 2 positions shown; findings below may reference images not displayed]

FINDINGS: The heart size and mediastinal contours are within normal limits.
Both lungs are clear. The visualized skeletal structures are
unremarkable.
IMPRESSION: No acute abnormality of the lungs.

## 2020-12-11 ENCOUNTER — Ambulatory Visit: Payer: BC Managed Care – PPO | Admitting: Family Medicine

## 2020-12-19 ENCOUNTER — Other Ambulatory Visit: Payer: Self-pay | Admitting: Family Medicine

## 2020-12-25 ENCOUNTER — Encounter: Payer: Self-pay | Admitting: Family Medicine

## 2020-12-25 ENCOUNTER — Other Ambulatory Visit: Payer: Self-pay

## 2020-12-25 ENCOUNTER — Ambulatory Visit: Payer: BC Managed Care – PPO | Admitting: Family Medicine

## 2020-12-25 VITALS — BP 120/76 | HR 108 | Temp 98.2°F | Ht 68.0 in | Wt 209.0 lb

## 2020-12-25 DIAGNOSIS — I10 Essential (primary) hypertension: Secondary | ICD-10-CM

## 2020-12-25 DIAGNOSIS — R000 Tachycardia, unspecified: Secondary | ICD-10-CM

## 2020-12-25 LAB — BASIC METABOLIC PANEL
BUN: 18 mg/dL (ref 6–23)
CO2: 28 mEq/L (ref 19–32)
Calcium: 9.2 mg/dL (ref 8.4–10.5)
Chloride: 102 mEq/L (ref 96–112)
Creatinine, Ser: 1.28 mg/dL (ref 0.40–1.50)
GFR: 66.84 mL/min (ref >60.00)
Glucose, Bld: 131 mg/dL — ABNORMAL HIGH (ref 70–99)
Potassium: 3.9 mEq/L (ref 3.5–5.1)
Sodium: 138 mEq/L (ref 135–145)

## 2020-12-25 NOTE — Progress Notes (Signed)
OFFICE VISIT  12/25/2020  CC:  Chief Complaint  Patient presents with   Follow-up    Hypertension, pt is not fasting   HPI:    Patient is a 45 y.o. male who presents for 6 mo f/u HTN and tachycardia. A/P as of last visit: "1) HTN: adequately controlled on hctz 25mg  qd and toprol xl 25mg  qd. Lytes/cr today. Encouraged him to still pursue the 24H ambulatory bp monitor but he's so far not had any luck b/c of high demand for this monitor and his busy (out of town) work schedule.   2) Sinus tachycardia: picked up when here in office visits, no sx's and pt has no outside HR data to add to this. Brief sensation of feeling HR slow down on days he has taken his toprol but no dizziness or fatigue and he says the sensation is brief and doesn't bother him much. I told him to go ahead and start taking the med every day now instead of every other day."  INTERIM HX: Still taking toprol xl twice a week but no rhyme or reason behind this schedule. He feels no periods of racing or irregular HB. Works out regularly.  Home bp monitoring: 120s/70s.   Past Medical History:  Diagnosis Date   Atypical chest pain    Hypertension    Obesity, Class I, BMI 30-34.9     History reviewed. No pertinent surgical history.  Outpatient Medications Prior to Visit  Medication Sig Dispense Refill   hydrochlorothiazide (HYDRODIURIL) 25 MG tablet Take 1 tablet (25 mg total) by mouth daily. 90 tablet 3   metoprolol succinate (TOPROL-XL) 25 MG 24 hr tablet Take 1 tablet (25 mg total) by mouth daily. 90 tablet 3   No facility-administered medications prior to visit.    No Known Allergies  ROS As per HPI  PE: Vitals with BMI 12/25/2020 06/08/2020 02/10/2020  Height 5\' 8"  5\' 8"  5\' 8"   Weight 209 lbs 214 lbs 13 oz 217 lbs 6 oz  BMI 31.79 32.67 33.06  Systolic 120 122 06/10/2020  Diastolic 76 82 86  Pulse 108 99 122   Gen: Alert, well appearing.  Patient is oriented to person, place, time, and situation. AFFECT:  pleasant, lucid thought and speech. CV: Regular, rate around 100, no m/r/g. LUNGS: CTA bilat, nonlabored. EXT: no clubbing or cyanosis.  no edema.    LABS:    Chemistry      Component Value Date/Time   NA 137 06/08/2020 1149   K 3.5 06/08/2020 1149   CL 99 06/08/2020 1149   CO2 27 06/08/2020 1149   BUN 20 06/08/2020 1149   CREATININE 1.27 06/08/2020 1149   CREATININE 1.32 07/19/2019 0853      Component Value Date/Time   CALCIUM 9.7 06/08/2020 1149   AST 27 07/19/2019 0853   ALT 25 07/19/2019 0853   BILITOT 0.6 07/19/2019 0853     Lab Results  Component Value Date   WBC 3.6 (L) 07/19/2019   HGB 15.8 07/19/2019   HCT 47.6 07/19/2019   MCV 87.5 07/19/2019   PLT 200 07/19/2019   IMPRESSION AND PLAN:  1) HTN: well controlled on hctz 25 qd. Lytes/cr today.  2) Sinus tachycardia: asymptomatic. Toprol on prn basis is okay--discouraged him from just randomly picking days to take this and he expressed understanding.  An After Visit Summary was printed and given to the patient.  FOLLOW UP: No follow-ups on file.  Signed:  07/21/2019, MD  12/25/2020  

## 2021-01-18 ENCOUNTER — Other Ambulatory Visit: Payer: Self-pay | Admitting: Family Medicine

## 2021-01-18 NOTE — Telephone Encounter (Signed)
Pt called to follow up on refill request  °

## 2021-03-15 DIAGNOSIS — R051 Acute cough: Secondary | ICD-10-CM | POA: Diagnosis not present

## 2021-03-15 DIAGNOSIS — J019 Acute sinusitis, unspecified: Secondary | ICD-10-CM | POA: Diagnosis not present

## 2021-03-23 IMAGING — DX DG HAND COMPLETE 3+V*L*
3 series · 3 of 3 positions shown · non-contrast
Comparison: None.

CLINICAL DATA: Injury, lacerations to third and fourth digits.

EXAM:
LEFT HAND - COMPLETE 3+ VIEW

[hand pa]
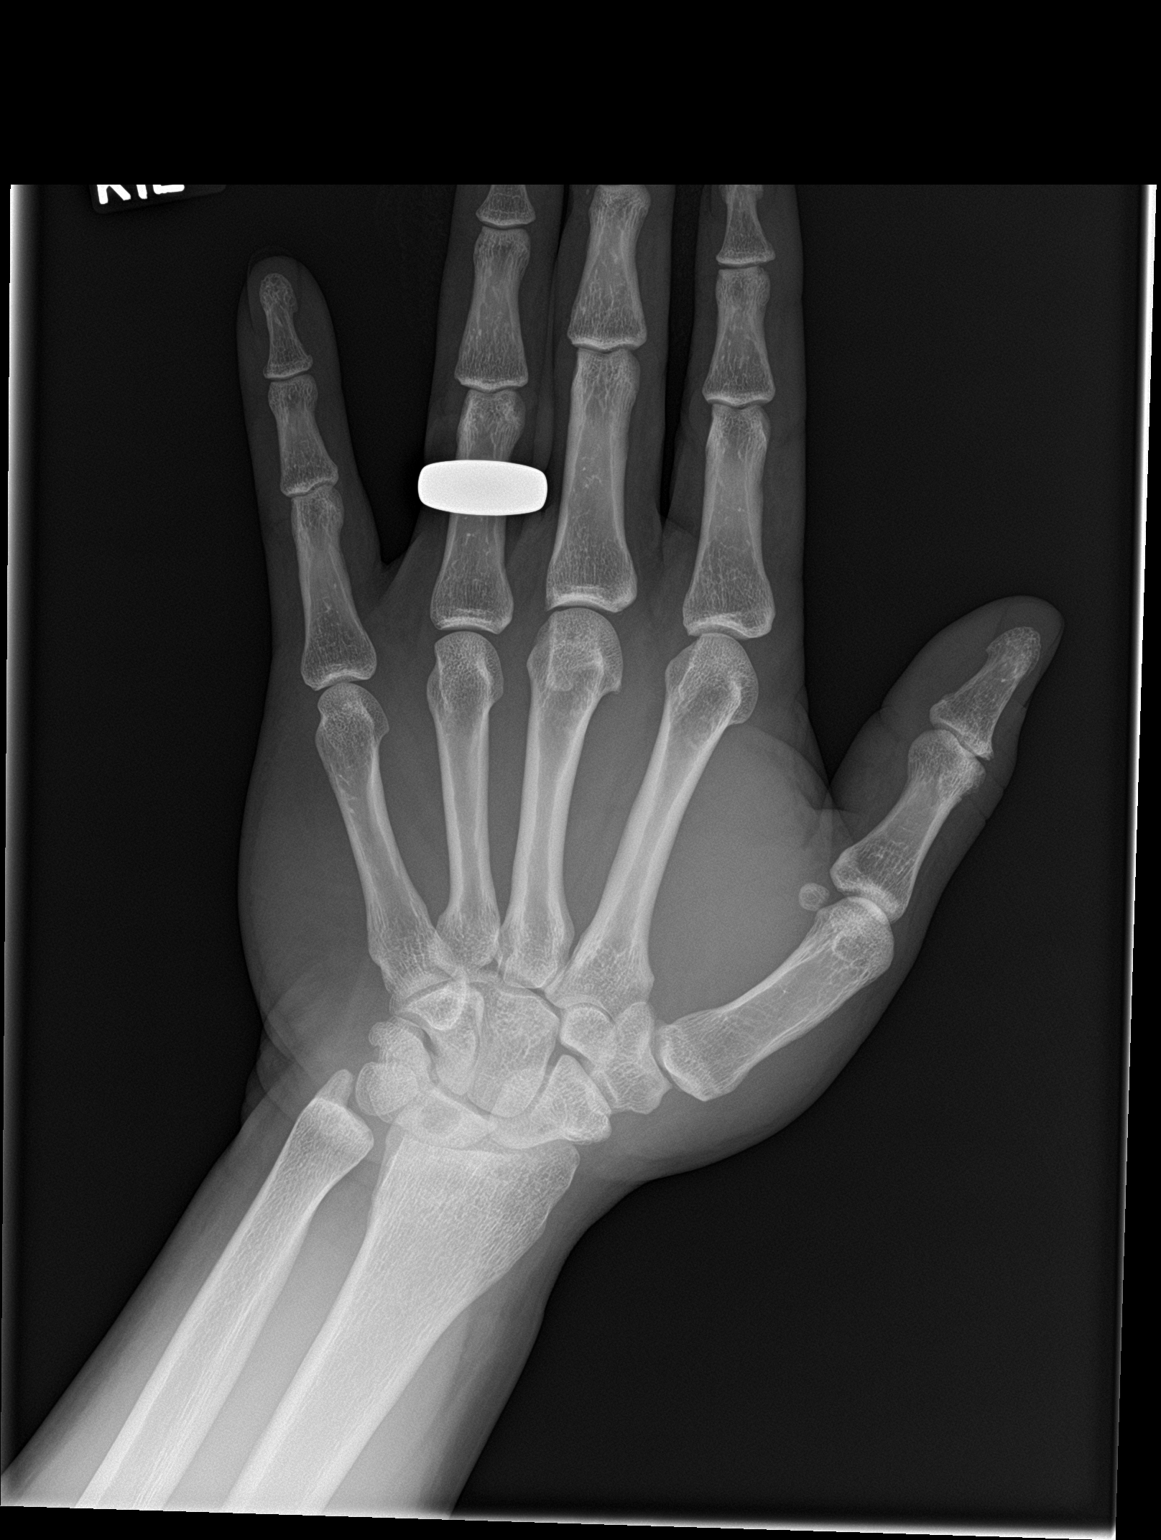

[hand obl]
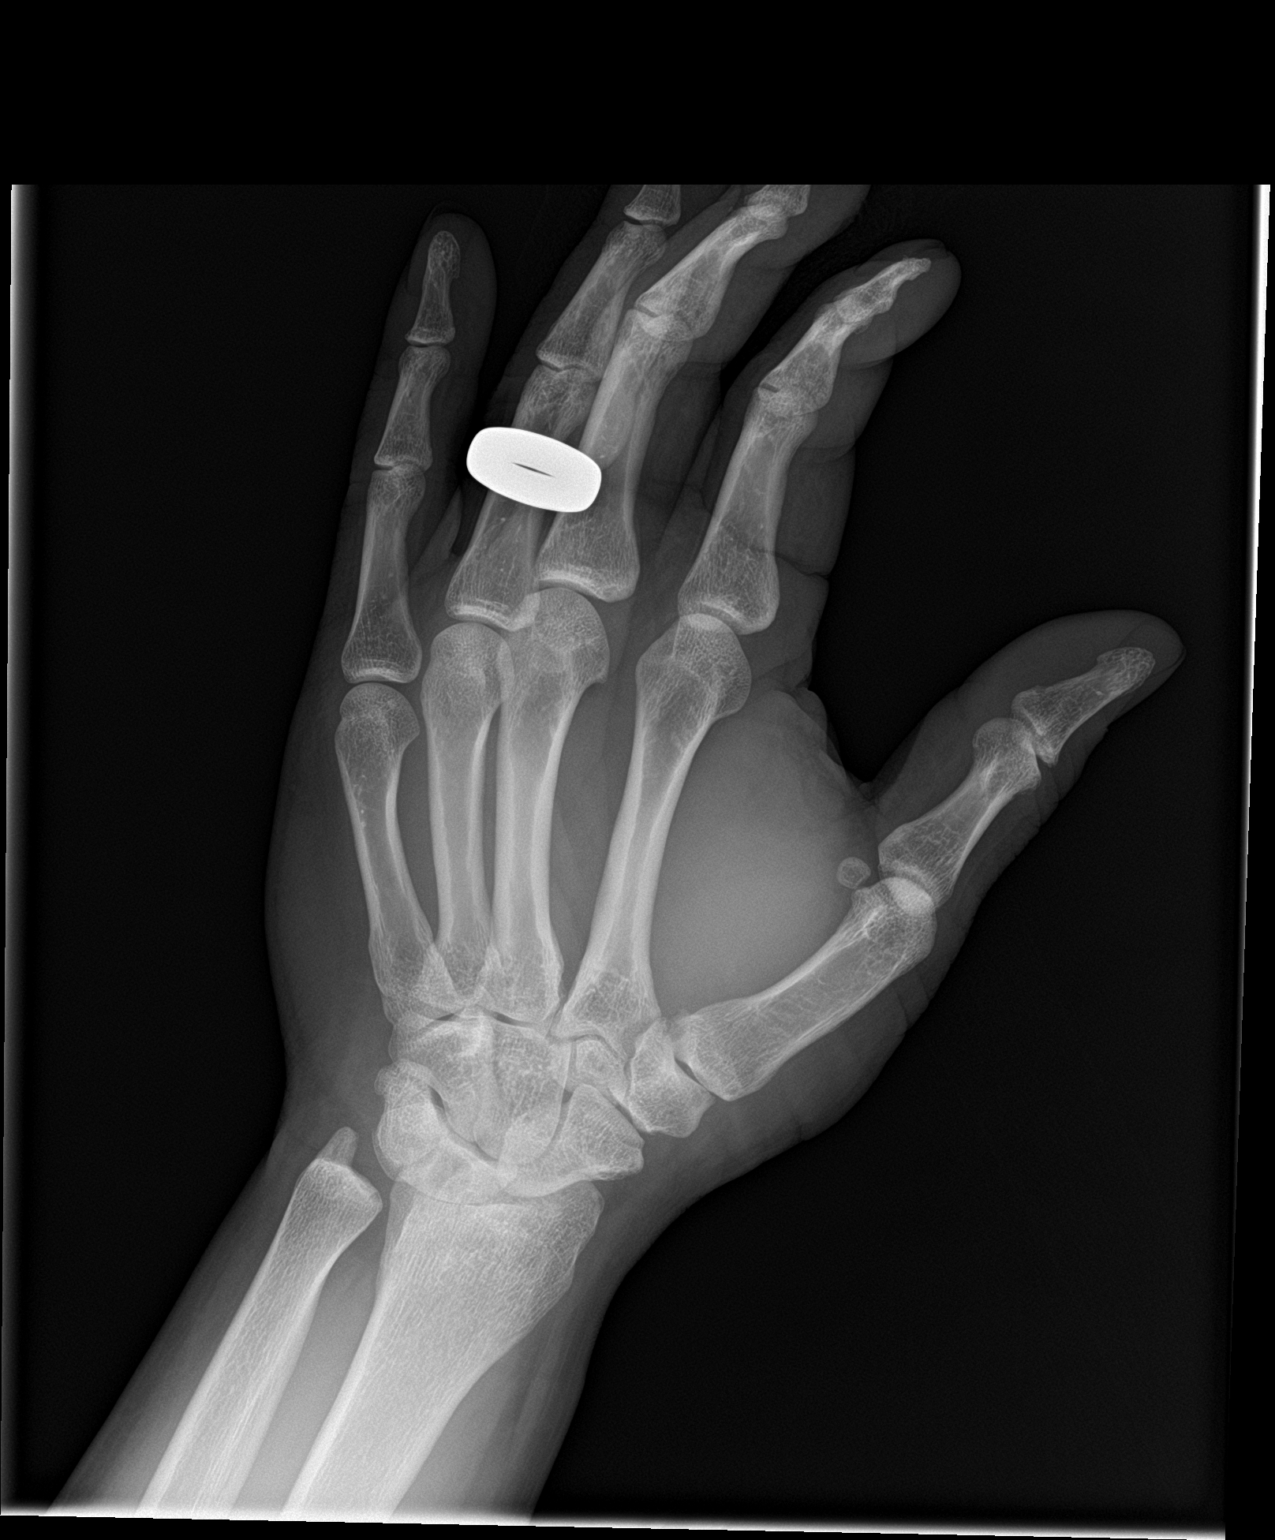

[hand lat]
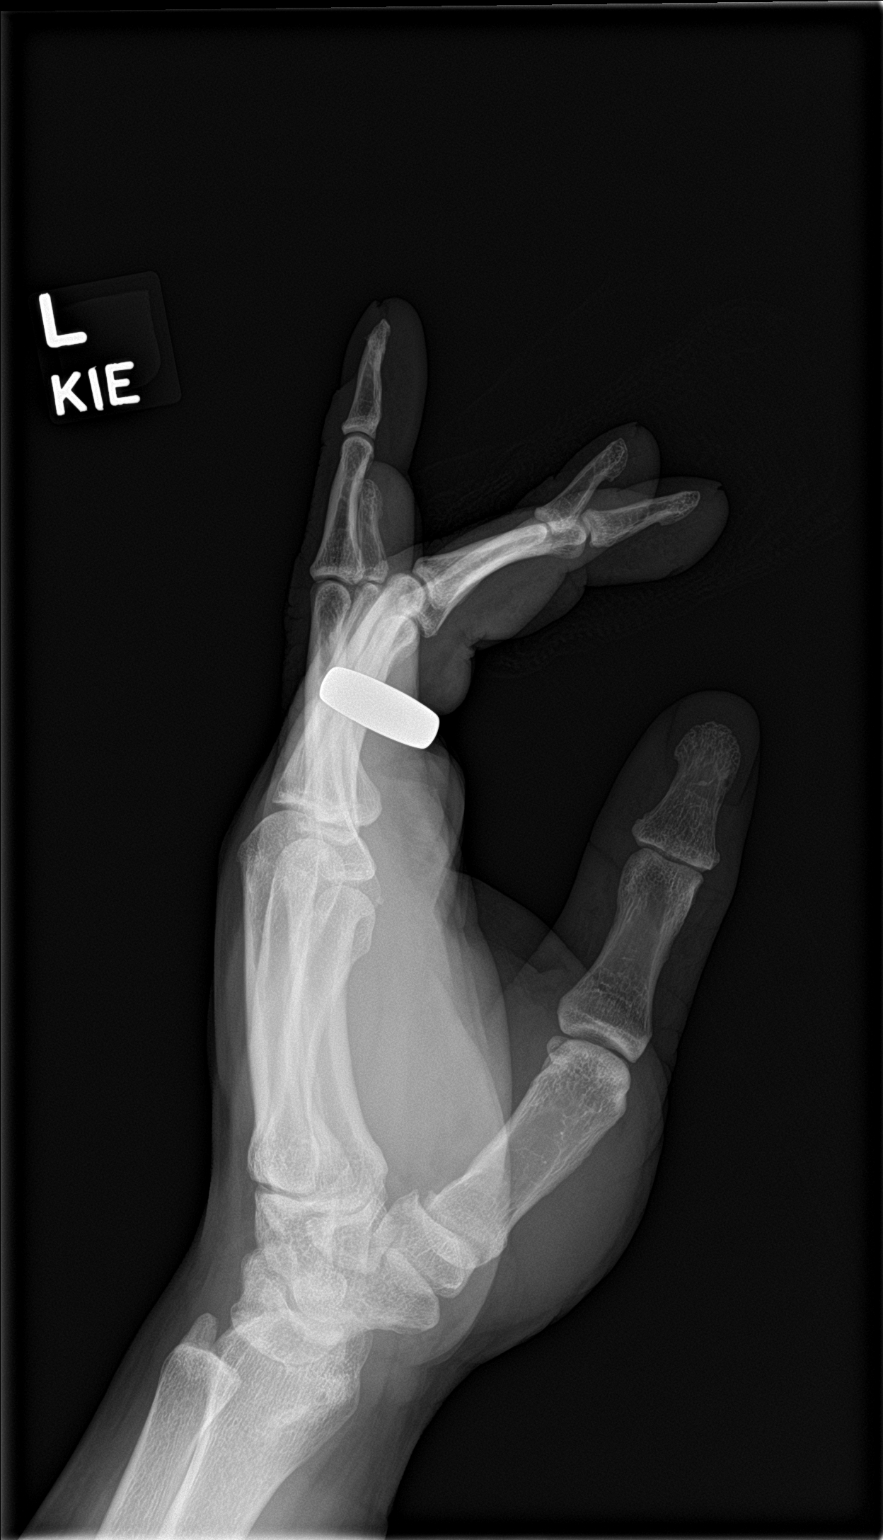

[3 of 3 positions shown; findings below may reference images not displayed]

FINDINGS: Osseous alignment is normal. No fracture line or displaced fracture
fragment seen. No foreign body appreciated within the soft tissues.
IMPRESSION: Negative.

## 2021-05-19 DIAGNOSIS — R7303 Prediabetes: Secondary | ICD-10-CM

## 2021-05-19 HISTORY — DX: Prediabetes: R73.03

## 2021-06-08 DIAGNOSIS — R0789 Other chest pain: Secondary | ICD-10-CM | POA: Diagnosis not present

## 2021-06-08 DIAGNOSIS — R42 Dizziness and giddiness: Secondary | ICD-10-CM | POA: Diagnosis not present

## 2021-06-08 DIAGNOSIS — R079 Chest pain, unspecified: Secondary | ICD-10-CM | POA: Diagnosis not present

## 2021-06-08 DIAGNOSIS — R9431 Abnormal electrocardiogram [ECG] [EKG]: Secondary | ICD-10-CM | POA: Diagnosis not present

## 2021-06-08 DIAGNOSIS — I10 Essential (primary) hypertension: Secondary | ICD-10-CM | POA: Diagnosis not present

## 2021-06-11 ENCOUNTER — Other Ambulatory Visit: Payer: Self-pay

## 2021-06-11 ENCOUNTER — Ambulatory Visit (INDEPENDENT_AMBULATORY_CARE_PROVIDER_SITE_OTHER): Payer: BC Managed Care – PPO | Admitting: Family Medicine

## 2021-06-11 ENCOUNTER — Encounter: Payer: Self-pay | Admitting: Family Medicine

## 2021-06-11 VITALS — BP 120/77 | HR 90 | Temp 97.6°F | Ht 69.0 in | Wt 194.6 lb

## 2021-06-11 DIAGNOSIS — Z1211 Encounter for screening for malignant neoplasm of colon: Secondary | ICD-10-CM | POA: Diagnosis not present

## 2021-06-11 DIAGNOSIS — R631 Polydipsia: Secondary | ICD-10-CM | POA: Diagnosis not present

## 2021-06-11 DIAGNOSIS — R739 Hyperglycemia, unspecified: Secondary | ICD-10-CM

## 2021-06-11 DIAGNOSIS — Z0001 Encounter for general adult medical examination with abnormal findings: Secondary | ICD-10-CM

## 2021-06-11 DIAGNOSIS — I10 Essential (primary) hypertension: Secondary | ICD-10-CM

## 2021-06-11 DIAGNOSIS — Z0000 Encounter for general adult medical examination without abnormal findings: Secondary | ICD-10-CM

## 2021-06-11 DIAGNOSIS — R0789 Other chest pain: Secondary | ICD-10-CM

## 2021-06-11 LAB — POCT URINALYSIS DIPSTICK
Bilirubin, UA: NEGATIVE
Blood, UA: NEGATIVE
Glucose, UA: NEGATIVE
Ketones, UA: NEGATIVE
Leukocytes, UA: NEGATIVE
Nitrite, UA: NEGATIVE
Protein, UA: POSITIVE — AB
Spec Grav, UA: 1.015 (ref 1.010–1.025)
Urobilinogen, UA: 0.2 E.U./dL
pH, UA: 7 (ref 5.0–8.0)

## 2021-06-11 LAB — LIPID PANEL
Cholesterol: 168 mg/dL (ref 0–200)
HDL: 51.1 mg/dL (ref >39.00)
LDL Cholesterol: 102 mg/dL — ABNORMAL HIGH (ref 0–99)
NonHDL: 117.39
Total CHOL/HDL Ratio: 3
Triglycerides: 75 mg/dL (ref 0.0–149.0)
VLDL: 15 mg/dL (ref 0.0–40.0)

## 2021-06-11 LAB — HEMOGLOBIN A1C: Hgb A1c MFr Bld: 6 % (ref 4.6–6.5)

## 2021-06-11 MED ORDER — METOPROLOL SUCCINATE ER 25 MG PO TB24: 25.0000 mg | ORAL_TABLET | Freq: Every day | ORAL | 1 refills | Status: DC

## 2021-06-11 MED ORDER — HYDROCHLOROTHIAZIDE 25 MG PO TABS: 25.0000 mg | ORAL_TABLET | Freq: Every day | ORAL | 1 refills | Status: DC

## 2021-06-11 NOTE — Progress Notes (Signed)
Office Note ?06/11/2021 ? ?CC:  ?Chief Complaint  ?Patient presents with  ? Annual Exam  ?  Pt is fasting  ? ?Patient is a 46 y.o. male who is here for annual health maintenance exam and 6 mo f/u HTN. ?A/P as of last visit: ?"1) HTN: well controlled on hctz 25 qd. ?Lytes/cr today. ?  ?2) Sinus tachycardia: asymptomatic. ?Toprol on prn basis is okay--discouraged him from just randomly picking days to take this and he expressed understanding." ? ?INTERIM HX: ?Gerald Gonzalez had an episode of chest pain and dizziness that prompted a visit to the [facility] emergency room. ?I reviewed this entire encounter today. ?EKG normal, chest x-ray normal, CBC, metabolic panel, and D-dimer all normal.  Troponins normal and flat.  CT head negative acute. ? ?He has had no further pain since that time.  He points to under the left clavicle as the location of his pain. ?He has had a few episodes sporadically of mild dizziness.  No vertigo.  Occasional feeling of blurry vision. ?No trigger.  Not positional.  No presyncope. ?He does feel excessive thirst.  He has urinated a lot ever since starting on the HCTZ a couple years ago. ?He has noted weight loss of 15 to 20 pounds over the last year.  He is eating a little less than he used to but says appetite is good overall and he has always exercised quite a bit. ?Good energy level.  He does feel like he has excessive thirst. ?His father has diabetes. ? ?Past Medical History:  ?Diagnosis Date  ? Atypical chest pain   ? Hypertension   ? Obesity, Class I, BMI 30-34.9   ? ? ?History reviewed. No pertinent surgical history. ? ?Family History  ?Problem Relation Age of Onset  ? Hypertension Mother   ? Hypertension Father   ? Stroke Father   ? Hypertension Brother   ? Dementia Maternal Grandfather   ? Stroke Paternal Grandmother   ? ? ?Social History  ? ?Socioeconomic History  ? Marital status: Married  ?  Spouse name: Not on file  ? Number of children: Not on file  ? Years of  education: Not on file  ? Highest education level: Not on file  ?Occupational History  ? Not on file  ?Tobacco Use  ? Smoking status: Never  ? Smokeless tobacco: Never  ?Vaping Use  ? Vaping Use: Never used  ?Substance and Sexual Activity  ? Alcohol use: Never  ? Drug use: Never  ? Sexual activity: Not on file  ?Other Topics Concern  ? Not on file  ?Social History Narrative  ? Married, 1 son.  ? Orig from [place], [place].  ? Lives in [place], [place].    ? Works in IT consultant.  ? No T/A/Ds.  ? ?Social Determinants of Health  ? ?Financial Resource Strain: Not on file  ?Food Insecurity: Not on file  ?Transportation Needs: Not on file  ?Physical Activity: Not on file  ?Stress: Not on file  ?Social Connections: Not on file  ?Intimate Partner Violence: Not on file  ? ? ?Outpatient Medications Prior to Visit  ?Medication Sig Dispense Refill  ? hydrochlorothiazide (HYDRODIURIL) 25 MG tablet TAKE 1 TABLET BY MOUTH EVERY DAY 90 tablet 1  ? metoprolol succinate (TOPROL-XL) 25 MG 24 hr tablet Take 1 tablet (25 mg total) by mouth daily. 90 tablet 3  ? ?No facility-administered medications prior to visit.  ? ? ?No Known Allergies ? ?ROS ?Review of Systems  ?  Constitutional:  Negative for appetite change, chills, fatigue and fever.  ?HENT:  Negative for congestion, dental problem, ear pain and sore throat.   ?Eyes:  Negative for discharge, redness and visual disturbance.  ?Respiratory:  Negative for cough, chest tightness, shortness of breath and wheezing.   ?Cardiovascular:  Negative for chest pain, palpitations and leg swelling.  ?Gastrointestinal:  Negative for abdominal pain, blood in stool, diarrhea, nausea and vomiting.  ?Endocrine: Positive for polydipsia and polyuria.  ?Genitourinary:  Negative for difficulty urinating, dysuria, flank pain, frequency, hematuria and urgency.  ?Musculoskeletal:  Negative for arthralgias, back pain, joint swelling, myalgias and neck stiffness.  ?Skin:  Negative for pallor and  rash.  ?Neurological:  Positive for dizziness. Negative for speech difficulty, weakness and headaches.  ?Hematological:  Negative for adenopathy. Does not bruise/bleed easily.  ?Psychiatric/Behavioral:  Negative for confusion and sleep disturbance. The patient is not nervous/anxious.   ? ?PE; ? ?  06/11/2021  ?  8:25 AM 12/25/2020  ? 10:30 AM 06/08/2020  ? 11:23 AM  ?Vitals with BMI  ?Height 5\' 9"  5\' 8"  5\' 8"   ?Weight 194 lbs 10 oz 209 lbs 214 lbs 13 oz  ?BMI 28.72 31.79 32.67  ?Systolic 120 120  ?Diastolic 77 76 82  ?Pulse 90 108 99  ? ?Gen: Alert, well appearing.  Patient is oriented to person, place, time, and situation. ?AFFECT: pleasant, lucid thought and speech. ?ENT: Ears: EACs clear, normal epithelium.  TMs with good light reflex and landmarks bilaterally.  Eyes: no injection, icteris, swelling, or exudate.  EOMI, PERRLA. ?Nose: no drainage or turbinate edema/swelling.  No injection or focal lesion.  Mouth: lips without lesion/swelling.  Oral mucosa pink and moist.  Dentition intact and without obvious caries or gingival swelling.  Oropharynx without erythema, exudate, or swelling.  ?Neck: supple/nontender.  No LAD, mass, or TM.  Carotid pulses 2+ bilaterally, without bruits. ?CV: RRR, no m/r/g.   ?LUNGS: CTA bilat, nonlabored resps, good aeration in all lung fields. ?ABD: soft, NT, ND, BS normal.  No hepatospenomegaly or mass.  No bruits. ?EXT: no clubbing, cyanosis, or edema.  ?Musculoskeletal: no joint swelling, erythema, warmth, or tenderness.  ROM of all joints intact. ?Skin - no sores or suspicious lesions or rashes or color changes ? ?Pertinent labs:  ?Lab Results  ?Component Value Date  ? TSH 0.94 07/19/2019  ? ?Lab Results  ?Component Value Date  ? WBC 3.6 (L) 07/19/2019  ? HGB 15.8 07/19/2019  ? HCT 47.6 07/19/2019  ? MCV 87.5 07/19/2019  ? PLT 200 07/19/2019  ? ?Lab Results  ?Component Value Date  ? CREATININE 1.28 12/25/2020  ? BUN 18 12/25/2020  ? NA 138 12/25/2020  ? K 3.9 12/25/2020  ? CL 102  12/25/2020  ? CO2 28 12/25/2020  ? ?Lab Results  ?Component Value Date  ? ALT 25 07/19/2019  ? AST 27 07/19/2019  ? BILITOT 0.6 07/19/2019  ? ?Lab Results  ?Component Value Date  ? CHOL 171 07/19/2019  ? ?Lab Results  ?Component Value Date  ? HDL 51 07/19/2019  ? ?Lab Results  ?Component Value Date  ? LDLCALC 106 (H) 07/19/2019  ? ?Lab Results  ?Component Value Date  ? TRIG 58 07/19/2019  ? ?Lab Results  ?Component Value Date  ? CHOLHDL 3.4 07/19/2019  ? ?ASSESSMENT AND PLAN:  ? ?#1 atypical chest pain.  Resolved.  Suspect musculoskeletal etiology. ?Work-up in the ED reassuring. ? ?#2 episodic disequilibrium.  Mild but unexplained. ?Do not know if  this may be related to elevated sugars (intermittent blurred vision plus has excessive thirst question polyuria as well).  Mild elevation of glucose in the ED but he was not fasting. ?Check UA for glucose today and check A1c. ? ?#3 hypertension, well controlled on HCTZ 25 mg a day and Toprol-XL 25 mg a day. ? ?#4 Health maintenance exam: ?Reviewed age and gender appropriate health maintenance issues (prudent diet, regular exercise, health risks of tobacco and excessive alcohol, use of seatbelts, fire alarms in home, use of sunscreen).  Also reviewed age and gender appropriate health screening as well as vaccine recommendations. ?Vaccines: Tdap UTD. ?Labs: fasting lipids and TSH ordered.  CBC and CMet normal in the ED a few days ago. ?Prostate ca screening: average risk patient= as per latest guidelines, start screening at 4950 yrs of age. ?Colon ca screening: due for initial screening.  Options discusssed-->referral to GI ordered today. ? ?An After Visit Summary was printed and given to the patient. ? ?FOLLOW UP:  Return in about 6 months (around 12/12/2021) for routine chronic illness f/u. ? ?Signed:  Santiago BumpersPhil Chord Takahashi, MD           06/11/2021 ? ?

## 2021-06-11 NOTE — Patient Instructions (Signed)

## 2021-06-14 ENCOUNTER — Telehealth: Payer: Self-pay

## 2021-06-14 NOTE — Telephone Encounter (Signed)
Patient called regarding his lab results from Friday. ? ?Patient can be reached at [phone]. ?

## 2021-06-14 NOTE — Telephone Encounter (Signed)
Pt advised labs have not reviewed as of yet. We will follow up once completed. He voiced understanding. ? ?[facility] Day - Client ?Nonclinical Telephone Record  ?AccessNurse? ?Client [facility] Day - Client ?Client Site [facility] - Day ?Provider Santiago Bumpers - MD ?Contact Type Call ?Who Is Calling Patient / Member / Family / Caregiver ?Caller Name Ebb Carelock ?Caller Phone Number [phone] ?Patient Name Gerald Gonzalez ?Patient DOB 06/11/1975 ?Call Type Message Only Information Provided ?Reason for Call Request for Lab/Test Results ?Initial Comment Caller had an appointment today and would like test results ?Disp. Time Disposition Final User ?06/11/2021 5:11:08 PM General Information Provided Yes Rutherford Nail ?Call Closed By: Rutherford Nail ?Transaction Date/Time: 06/11/2021 5:06:14 PM (ET) ?

## 2021-06-15 LAB — TSH: TSH: 0.74 u[IU]/mL (ref 0.35–5.50)

## 2021-06-17 ENCOUNTER — Encounter: Payer: Self-pay | Admitting: Family Medicine

## 2021-12-13 ENCOUNTER — Ambulatory Visit: Payer: BC Managed Care – PPO | Admitting: Family Medicine

## 2022-02-18 ENCOUNTER — Ambulatory Visit: Payer: BC Managed Care – PPO | Admitting: Family Medicine

## 2022-03-18 ENCOUNTER — Ambulatory Visit: Payer: BC Managed Care – PPO | Admitting: Family Medicine

## 2022-04-01 ENCOUNTER — Ambulatory Visit: Payer: BC Managed Care – PPO | Admitting: Family Medicine

## 2022-04-08 ENCOUNTER — Ambulatory Visit: Payer: BC Managed Care – PPO | Admitting: Family Medicine

## 2022-04-08 ENCOUNTER — Encounter: Payer: Self-pay | Admitting: Family Medicine

## 2022-04-08 VITALS — BP 130/81 | HR 98 | Temp 98.3°F | Ht 69.0 in | Wt 212.0 lb

## 2022-04-08 DIAGNOSIS — I10 Essential (primary) hypertension: Secondary | ICD-10-CM

## 2022-04-08 DIAGNOSIS — R7303 Prediabetes: Secondary | ICD-10-CM | POA: Diagnosis not present

## 2022-04-08 MED ORDER — HYDROCHLOROTHIAZIDE 25 MG PO TABS: 25.0000 mg | ORAL_TABLET | Freq: Every day | ORAL | 1 refills | Status: DC

## 2022-04-08 MED ORDER — METOPROLOL SUCCINATE ER 25 MG PO TB24
25.0000 mg | ORAL_TABLET | Freq: Every day | ORAL | 1 refills | Status: DC
Start: 2022-04-08 — End: 2022-11-30

## 2022-04-08 NOTE — Progress Notes (Signed)
OFFICE VISIT  04/08/2022  CC:  Chief Complaint  Patient presents with   Medical Management of Chronic Issues    Pt is fasting    Patient is a 47 y.o. male who presents for 34-month follow-up hypertension and prediabetes. A/P as of last visit: "#1 atypical chest pain.  Resolved.  Suspect musculoskeletal etiology. Work-up in the ED reassuring.   #2 episodic disequilibrium.  Mild but unexplained. Do not know if this may be related to elevated sugars (intermittent blurred vision plus has excessive thirst question polyuria as well).  Mild elevation of glucose in the ED but he was not fasting. Check UA for glucose today and check A1c.   #3 hypertension, well controlled on HCTZ 25 mg a day and Toprol-XL 25 mg a day.   #4 Health maintenance exam: Reviewed age and gender appropriate health maintenance issues (prudent diet, regular exercise, health risks of tobacco and excessive alcohol, use of seatbelts, fire alarms in home, use of sunscreen).  Also reviewed age and gender appropriate health screening as well as vaccine recommendations. Vaccines: Tdap UTD. Labs: fasting lipids and TSH ordered.  CBC and CMet normal in the ED a few days ago. Prostate ca screening: average risk patient= as per latest guidelines, start screening at 19 yrs of age. Colon ca screening: due for initial screening.  Options discusssed-->referral to GI ordered today."  INTERIM HX: Araceli feels well. Has been traveling a lot--[place], [place], [place]. His son is an excellent soccer player and plays all over the world.  Taking meds, no problem.  He works out regularly.   Past Medical History:  Diagnosis Date   Atypical chest pain    Hypertension    Obesity, Class I, BMI 30-34.9    Prediabetes 05/2021   05/2021 a1c 6.0%    History reviewed. No pertinent surgical history.  Outpatient Medications Prior to Visit  Medication Sig Dispense Refill   hydrochlorothiazide (HYDRODIURIL) 25 MG tablet Take 1 tablet  (25 mg total) by mouth daily. 90 tablet 1   metoprolol succinate (TOPROL-XL) 25 MG 24 hr tablet Take 1 tablet (25 mg total) by mouth daily. 90 tablet 1   No facility-administered medications prior to visit.    No Known Allergies  Review of Systems As per HPI  PE:    04/08/2022    2:52 PM 06/11/2021    8:25 AM 12/25/2020   10:30 AM  Vitals with BMI  Height 5\' 9"  5\' 9"  5\' 8"   Weight 212 lbs 194 lbs 10 oz 209 lbs  BMI 31.29 25.95 63.87  Systolic 564 332 951  Diastolic 81 77 76  Pulse 98 90 108    Physical Exam  Gen: Alert, well appearing.  Patient is oriented to person, place, time, and situation. AFFECT: pleasant, lucid thought and speech. No further exam today  LABS:  Last CBC Lab Results  Component Value Date   WBC 3.6 (L) 07/19/2019   HGB 15.8 07/19/2019   HCT 47.6 07/19/2019   MCV 87.5 07/19/2019   MCH 29.0 07/19/2019   RDW 13.0 07/19/2019   PLT 200 88/41/6606   Last metabolic panel Lab Results  Component Value Date   GLUCOSE 131 (H) 12/25/2020   NA 138 12/25/2020   K 3.9 12/25/2020   CL 102 12/25/2020   CO2 28 12/25/2020   BUN 18 12/25/2020   CREATININE 1.28 12/25/2020   GFRNONAA >60 02/08/2019   CALCIUM 9.2 12/25/2020   PROT 7.2 07/19/2019   BILITOT 0.6 07/19/2019   AST  27 07/19/2019   ALT 25 07/19/2019   ANIONGAP 11 02/08/2019   Last lipids Lab Results  Component Value Date   CHOL 168 06/11/2021   HDL 51.10 06/11/2021   LDLCALC 102 (H) 06/11/2021   TRIG 75.0 06/11/2021   CHOLHDL 3 06/11/2021   Last hemoglobin A1c Lab Results  Component Value Date   HGBA1C 6.0 06/11/2021   Last thyroid functions Lab Results  Component Value Date   TSH 0.74 06/11/2021   IMPRESSION AND PLAN:  #1 hypertension, well-controlled on hydrochlorothiazide 25 mg a day and Toprol-XL 25 mg a day. BMET today.  2.  Prediabetes. Hemoglobin A1c today.  An After Visit Summary was printed and given to the patient.  FOLLOW UP: Return in about 6 months (around  10/07/2022) for annual CPE (fasting).  Signed:  Crissie Sickles, MD           04/08/2022

## 2022-04-09 LAB — BASIC METABOLIC PANEL
BUN: 19 mg/dL (ref 7–25)
CO2: 23 mmol/L (ref 20–32)
Calcium: 9.1 mg/dL (ref 8.6–10.3)
Chloride: 106 mmol/L (ref 98–110)
Creat: 1.23 mg/dL (ref 0.60–1.29)
Glucose, Bld: 85 mg/dL (ref 65–99)
Potassium: 4.1 mmol/L (ref 3.5–5.3)
Sodium: 139 mmol/L (ref 135–146)

## 2022-04-09 LAB — HEMOGLOBIN A1C
Hgb A1c MFr Bld: 5.8 % of total Hgb — ABNORMAL HIGH (ref <5.7)
Mean Plasma Glucose: 120 mg/dL
eAG (mmol/L): 6.6 mmol/L

## 2022-10-07 ENCOUNTER — Encounter: Payer: BC Managed Care – PPO | Admitting: Family Medicine

## 2022-10-19 ENCOUNTER — Encounter (INDEPENDENT_AMBULATORY_CARE_PROVIDER_SITE_OTHER): Payer: Self-pay

## 2022-11-03 ENCOUNTER — Encounter (INDEPENDENT_AMBULATORY_CARE_PROVIDER_SITE_OTHER): Payer: Self-pay

## 2022-11-11 ENCOUNTER — Encounter: Payer: BC Managed Care – PPO | Admitting: Family Medicine

## 2022-11-25 ENCOUNTER — Encounter: Payer: BC Managed Care – PPO | Admitting: Family Medicine

## 2022-12-02 ENCOUNTER — Ambulatory Visit (INDEPENDENT_AMBULATORY_CARE_PROVIDER_SITE_OTHER): Payer: BC Managed Care – PPO | Admitting: Family Medicine

## 2022-12-02 ENCOUNTER — Encounter: Payer: Self-pay | Admitting: Family Medicine

## 2022-12-02 VITALS — BP 137/75 | HR 100 | Temp 98.3°F | Resp 12 | Ht 69.0 in | Wt 216.6 lb

## 2022-12-02 DIAGNOSIS — Z1211 Encounter for screening for malignant neoplasm of colon: Secondary | ICD-10-CM

## 2022-12-02 DIAGNOSIS — Z0000 Encounter for general adult medical examination without abnormal findings: Secondary | ICD-10-CM | POA: Diagnosis not present

## 2022-12-02 DIAGNOSIS — I10 Essential (primary) hypertension: Secondary | ICD-10-CM

## 2022-12-02 DIAGNOSIS — R7303 Prediabetes: Secondary | ICD-10-CM | POA: Diagnosis not present

## 2022-12-02 DIAGNOSIS — Z13220 Encounter for screening for lipoid disorders: Secondary | ICD-10-CM | POA: Diagnosis not present

## 2022-12-02 LAB — LIPID PANEL
Cholesterol: 183 mg/dL (ref 0–200)
HDL: 53.3 mg/dL (ref >39.00)
LDL Cholesterol: 117 mg/dL — ABNORMAL HIGH (ref 0–99)
NonHDL: 129.4
Total CHOL/HDL Ratio: 3
Triglycerides: 64 mg/dL (ref 0.0–149.0)
VLDL: 12.8 mg/dL (ref 0.0–40.0)

## 2022-12-02 LAB — CBC WITH DIFFERENTIAL/PLATELET
Basophils Absolute: 0 10*3/uL (ref 0.0–0.1)
Basophils Relative: 0.3 % (ref 0.0–3.0)
Eosinophils Absolute: 0.4 10*3/uL (ref 0.0–0.7)
Eosinophils Relative: 9 % — ABNORMAL HIGH (ref 0.0–5.0)
HCT: 47.7 % (ref 39.0–52.0)
Hemoglobin: 15.7 g/dL (ref 13.0–17.0)
Lymphocytes Relative: 51.4 % — ABNORMAL HIGH (ref 12.0–46.0)
Lymphs Abs: 2 10*3/uL (ref 0.7–4.0)
MCHC: 32.9 g/dL (ref 30.0–36.0)
MCV: 88.4 fl (ref 78.0–100.0)
Monocytes Absolute: 0.5 10*3/uL (ref 0.1–1.0)
Monocytes Relative: 11.9 % (ref 3.0–12.0)
Neutro Abs: 1.1 10*3/uL — ABNORMAL LOW (ref 1.4–7.7)
Neutrophils Relative %: 27.4 % — ABNORMAL LOW (ref 43.0–77.0)
Platelets: 192 10*3/uL (ref 150.0–400.0)
RBC: 5.39 Mil/uL (ref 4.22–5.81)
RDW: 13.6 % (ref 11.5–15.5)
WBC: 3.9 10*3/uL — ABNORMAL LOW (ref 4.0–10.5)

## 2022-12-02 LAB — HEMOGLOBIN A1C: Hgb A1c MFr Bld: 5.9 % (ref 4.6–6.5)

## 2022-12-02 LAB — COMPREHENSIVE METABOLIC PANEL
ALT: 30 U/L (ref 0–53)
AST: 28 U/L (ref 0–37)
Albumin: 4.2 g/dL (ref 3.5–5.2)
Alkaline Phosphatase: 58 U/L (ref 39–117)
BUN: 22 mg/dL (ref 6–23)
CO2: 28 meq/L (ref 19–32)
Calcium: 9.1 mg/dL (ref 8.4–10.5)
Chloride: 105 meq/L (ref 96–112)
Creatinine, Ser: 1.25 mg/dL (ref 0.40–1.50)
GFR: 67.84 mL/min (ref >60.00)
Glucose, Bld: 83 mg/dL (ref 70–99)
Potassium: 4.1 meq/L (ref 3.5–5.1)
Sodium: 139 meq/L (ref 135–145)
Total Bilirubin: 0.7 mg/dL (ref 0.2–1.2)
Total Protein: 7.2 g/dL (ref 6.0–8.3)

## 2022-12-02 LAB — TSH: TSH: 0.63 u[IU]/mL (ref 0.35–5.50)

## 2022-12-02 MED ORDER — METOPROLOL SUCCINATE ER 25 MG PO TB24: 25.0000 mg | ORAL_TABLET | Freq: Every day | ORAL | 1 refills | Status: DC

## 2022-12-02 MED ORDER — HYDROCHLOROTHIAZIDE 25 MG PO TABS: 25.0000 mg | ORAL_TABLET | Freq: Every day | ORAL | 1 refills | Status: DC

## 2022-12-02 NOTE — Progress Notes (Signed)
Office Note 12/02/2022  CC:  Chief Complaint  Patient presents with   Annual Exam    Pt is fasting; declined all vaccines and colon cancer screening    HPI:  Patient is a 47 y.o. male who is here for annual health maintenance exam and follow-up hypertension.  Gerald Gonzalez feels well.  He is traveling over the country a lot for his job.  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 He admits he has not been eating as well as he used to.  Not as active because of all the travel. No home blood pressure monitoring. He has been compliant with his blood pressure medication.  Past Medical History:  Diagnosis Date   Atypical chest pain    Hypertension    Obesity, Class I, BMI 30-34.9    Prediabetes 05/2021   05/2021 a1c 6.0%    History reviewed. No pertinent surgical history.  Family History  Problem Relation Age of Onset   Hypertension Mother    Hypertension Father    Stroke Father    Hypertension Brother    Dementia Maternal Grandfather    Stroke Paternal Grandmother     Social History   Socioeconomic History   Marital status: Married    Spouse name: Not on file   Number of children: Not on file   Years of education: Not on file   Highest education level: Not on file  Occupational History   Not on file  Tobacco Use   Smoking status: Never   Smokeless tobacco: Never  Vaping Use   Vaping status: Never Used  Substance and Sexual Activity   Alcohol use: Never   Drug use: Never   Sexual activity: Not on file  Other Topics Concern   Not on file  Social History Narrative   Married, 1 son.   Orig from [place], [place].   Lives in [place], [place].     Works in IT consultant.   No T/A/Ds.   Social Determinants of Health   Financial Resource Strain: Not on file  Food Insecurity: Not on file  Transportation Needs: Not on file  Physical Activity: Not on file  Stress: Not on file  Social Connections: Unknown (08/03/2021)   Received from [facility]    Social Network    Social Network: Not on file  Intimate Partner Violence: Unknown (06/25/2021)   Received from [facility], [facility]   HITS    Physically Hurt: Not on file    Insult or Talk Down To: Not on file    Threaten Physical Harm: Not on file    Scream or Curse: Not on file    Outpatient Medications Prior to Visit  Medication Sig Dispense Refill   hydrochlorothiazide (HYDRODIURIL) 25 MG tablet Take 1 tablet (25 mg total) by mouth daily. 90 tablet 1   metoprolol succinate (TOPROL-XL) 25 MG 24 hr tablet Take 1 tablet (25 mg total) by mouth daily. 90 tablet 1   No facility-administered medications prior to visit.    No Known Allergies  Review of Systems  Constitutional:  Negative for appetite change, chills, fatigue and fever.  HENT:  Negative for congestion, dental problem, ear pain and sore throat.   Eyes:  Negative for discharge, redness and visual disturbance.  Respiratory:  Negative for cough, chest tightness, shortness of breath and wheezing.   Cardiovascular:  Negative for chest pain, palpitations and leg swelling.  Gastrointestinal:  Negative for abdominal pain, blood in stool, diarrhea, nausea and vomiting.  Genitourinary:  Negative for difficulty urinating, dysuria, flank pain, frequency, hematuria and urgency.  Musculoskeletal:  Negative for arthralgias, back pain, joint swelling, myalgias and neck stiffness.  Skin:  Negative for pallor and rash.  Neurological:  Negative for dizziness, speech difficulty, weakness and headaches.  Hematological:  Negative for adenopathy. Does not bruise/bleed easily.  Psychiatric/Behavioral:  Negative for confusion and sleep disturbance. The patient is not nervous/anxious.     PE;    12/02/2022    1:03 PM 04/08/2022    2:52 PM 06/11/2021    8:25 AM  Vitals with BMI  Height 5\' 9"  5\' 9"  5\' 9"   Weight 216 lbs 10 oz 212 lbs 194 lbs 10 oz  BMI 31.97 31.29 28.72  Systolic 137 130 875  Diastolic 75 81 77  Pulse 100 98 90     Gen: Alert, well appearing.  Patient is oriented to person, place, time, and situation. AFFECT: pleasant, lucid thought and speech. ENT: Ears: EACs clear, normal epithelium.  TMs with good light reflex and landmarks bilaterally.  Eyes: no injection, icteris, swelling, or exudate.  EOMI, PERRLA. Nose: no drainage or turbinate edema/swelling.  No injection or focal lesion.  Mouth: lips without lesion/swelling.  Oral mucosa pink and moist.  Dentition intact and without obvious caries or gingival swelling.  Oropharynx without erythema, exudate, or swelling.  Neck: supple/nontender.  No LAD, mass, or TM.  Carotid pulses 2+ bilaterally, without bruits. CV: RRR, no m/r/g.   LUNGS: CTA bilat, nonlabored resps, good aeration in all lung fields. ABD: soft, NT, ND, BS normal.  No hepatospenomegaly or mass.  No bruits. EXT: no clubbing, cyanosis, or edema.  Musculoskeletal: no joint swelling, erythema, warmth, or tenderness.  ROM of all joints intact. Skin - no sores or suspicious lesions or rashes or color changes  Pertinent labs:  Lab Results  Component Value Date   TSH 0.74 06/11/2021   Lab Results  Component Value Date   WBC 3.6 (L) 07/19/2019   HGB 15.8 07/19/2019   HCT 47.6 07/19/2019   MCV 87.5 07/19/2019   PLT 200 07/19/2019   Lab Results  Component Value Date   CREATININE 1.23 04/08/2022   BUN 19 04/08/2022   NA 139 04/08/2022   K 4.1 04/08/2022   CL 106 04/08/2022   CO2 23 04/08/2022   Lab Results  Component Value Date   ALT 25 07/19/2019   AST 27 07/19/2019   BILITOT 0.6 07/19/2019   Lab Results  Component Value Date   CHOL 168 06/11/2021   Lab Results  Component Value Date   HDL 51.10 06/11/2021   Lab Results  Component Value Date   LDLCALC 102 (H) 06/11/2021   Lab Results  Component Value Date   TRIG 75.0 06/11/2021   Lab Results  Component Value Date   CHOLHDL 3 06/11/2021   Lab Results  Component Value Date   HGBA1C 5.8 (H) 04/08/2022    ASSESSMENT AND PLAN:   #1 health maintenance exam: Reviewed age and gender appropriate health maintenance issues (prudent diet, regular exercise, health risks of tobacco and excessive alcohol, use of seatbelts, fire alarms in home, use of sunscreen).  Also reviewed age and gender appropriate health screening as well as vaccine recommendations. Vaccines: Flu-> declined.  Tdap UTD. Labs: Health panel labs ordered, also Hba1c (prediabetes). Prostate ca screening: average risk patient= as per latest guidelines, start screening at 29 yrs of age. Colon ca screening: due for initial screening.  Options discusssed--> he will consider.  #  2 hypertension, well-controlled on Toprol-XL 25 mg a day and HCTZ 25 mg a day. Electrolytes and creatinine monitoring today.  An After Visit Summary was printed and given to the patient.  FOLLOW UP:  Return in about 6 months (around 06/01/2023) for routine chronic illness f/u.  Signed:  Santiago Bumpers, MD           12/02/2022

## 2023-05-19 DIAGNOSIS — R051 Acute cough: Secondary | ICD-10-CM | POA: Diagnosis not present

## 2023-05-19 DIAGNOSIS — J069 Acute upper respiratory infection, unspecified: Secondary | ICD-10-CM | POA: Diagnosis not present

## 2023-06-02 ENCOUNTER — Ambulatory Visit: Payer: BC Managed Care – PPO | Admitting: Family Medicine

## 2023-07-13 NOTE — Patient Instructions (Signed)

## 2023-07-14 ENCOUNTER — Encounter: Payer: Self-pay | Admitting: Family Medicine

## 2023-07-14 ENCOUNTER — Other Ambulatory Visit (HOSPITAL_COMMUNITY)
Admission: RE | Admit: 2023-07-14 | Discharge: 2023-07-14 | Disposition: A | Discharge status: Home / Self Care | Source: Ambulatory Visit | Attending: Family Medicine | Admitting: Family Medicine

## 2023-07-14 ENCOUNTER — Ambulatory Visit: Admitting: Family Medicine

## 2023-07-14 VITALS — BP 134/87 | HR 98 | Temp 98.2°F | Wt 223.0 lb

## 2023-07-14 DIAGNOSIS — Z113 Encounter for screening for infections with a predominantly sexual mode of transmission: Secondary | ICD-10-CM | POA: Insufficient documentation

## 2023-07-14 DIAGNOSIS — I10 Essential (primary) hypertension: Secondary | ICD-10-CM

## 2023-07-14 DIAGNOSIS — R7303 Prediabetes: Secondary | ICD-10-CM | POA: Diagnosis not present

## 2023-07-14 LAB — BASIC METABOLIC PANEL WITH GFR
BUN: 15 mg/dL (ref 6–23)
CO2: 28 meq/L (ref 19–32)
Calcium: 8.9 mg/dL (ref 8.4–10.5)
Chloride: 105 meq/L (ref 96–112)
Creatinine, Ser: 1.2 mg/dL (ref 0.40–1.50)
GFR: 70.94 mL/min (ref >60.00)
Glucose, Bld: 139 mg/dL — ABNORMAL HIGH (ref 70–99)
Potassium: 4 meq/L (ref 3.5–5.1)
Sodium: 141 meq/L (ref 135–145)

## 2023-07-14 MED ORDER — METOPROLOL SUCCINATE ER 25 MG PO TB24: 25.0000 mg | ORAL_TABLET | Freq: Every day | ORAL | 1 refills | Status: AC

## 2023-07-14 MED ORDER — HYDROCHLOROTHIAZIDE 25 MG PO TABS: 25.0000 mg | ORAL_TABLET | Freq: Every day | ORAL | 1 refills | Status: AC

## 2023-07-14 NOTE — Progress Notes (Signed)
 OFFICE VISIT  07/14/2023  CC:  Chief Complaint  Patient presents with   Medical Management of Chronic Issues    Patient is a 48 y.o. male who presents for 4-month follow-up hypertension and prediabetes. At last visit things were well-controlled and he was continued on Toprol -XL 25 mg a day and HCTZ 25 mg a day. All labs at that time were good.  INTERIM HX: Gerald Gonzalez feels well. He requests screening for STDs. Denies any history of STD.  No current symptoms of STD.  No home blood pressure monitoring.  He is working out regularly. Admits that he does not eat very healthy when he travels, which is fairly regularly.  Past Medical History:  Diagnosis Date   Atypical chest pain    Hypertension    Obesity, Class I, BMI 30-34.9    Prediabetes 05/2021   05/2021 a1c 6.0%    History reviewed. No pertinent surgical history.  Outpatient Medications Prior to Visit  Medication Sig Dispense Refill   hydrochlorothiazide  (HYDRODIURIL ) 25 MG tablet Take 1 tablet (25 mg total) by mouth daily. 90 tablet 1   metoprolol  succinate (TOPROL -XL) 25 MG 24 hr tablet Take 1 tablet (25 mg total) by mouth daily. 90 tablet 1   No facility-administered medications prior to visit.    No Known Allergies  Review of Systems As per HPI  PE:    07/14/2023    1:18 PM 12/02/2022    1:03 PM 04/08/2022    2:52 PM  Vitals with BMI  Height  5\' 9"  5\' 9"   Weight 223 lbs 216 lbs 10 oz 212 lbs  BMI  31.97 31.29  Systolic 134 137 161  Diastolic 87 75 81  Pulse 98 100 98    Physical Exam  Gen: Alert, well appearing.  Patient is oriented to person, place, time, and situation. AFFECT: pleasant, lucid thought and speech. No further exam today  LABS:  Last CBC Lab Results  Component Value Date   WBC 3.9 (L) 12/02/2022   HGB 15.7 12/02/2022   HCT 47.7 12/02/2022   MCV 88.4 12/02/2022   MCH 29.0 07/19/2019   RDW 13.6 12/02/2022   PLT 192.0 12/02/2022   Last metabolic panel Lab Results  Component  Value Date   GLUCOSE 83 12/02/2022   NA 139 12/02/2022   K 4.1 12/02/2022   CL 105 12/02/2022   CO2 28 12/02/2022   BUN 22 12/02/2022   CREATININE 1.25 12/02/2022   GFR 67.84 12/02/2022   CALCIUM 9.1 12/02/2022   PROT 7.2 12/02/2022   ALBUMIN 4.2 12/02/2022   BILITOT 0.7 12/02/2022   ALKPHOS 58 12/02/2022   AST 28 12/02/2022   ALT 30 12/02/2022   ANIONGAP 11 02/08/2019   Last lipids Lab Results  Component Value Date   CHOL 183 12/02/2022   HDL 53.30 12/02/2022   LDLCALC 117 (H) 12/02/2022   TRIG 64.0 12/02/2022   CHOLHDL 3 12/02/2022   Last hemoglobin A1c Lab Results  Component Value Date   HGBA1C 5.9 12/02/2022   IMPRESSION AND PLAN:  #1 hypertension with whitecoat component. Encouraged him to check his blood pressure a couple of times per month with his home cuff. Continue Toprol -XL 25 mg a day and HCTZ 25 mg a day. Basic metabolic panel today.  2.  STD screening. HIV antibody, RPR, and urine GC chlamydia ordered today.  #3 prediabetes. Nonfasting glucose today. We chose to defer hemoglobin A1c until next follow-up in 6 months. He will be working on improving diet.  He will continue great exercise habits.  An After Visit Summary was printed and given to the patient.  FOLLOW UP: Return in about 6 months (around 01/13/2024) for annual CPE (fasting).  Signed:  Arletha Lady, MD           07/14/2023

## 2023-07-15 LAB — RPR: RPR Ser Ql: NONREACTIVE

## 2023-07-15 LAB — HIV ANTIBODY (ROUTINE TESTING W REFLEX): HIV 1&2 Ab, 4th Generation: NONREACTIVE

## 2023-07-17 LAB — URINE CYTOLOGY ANCILLARY ONLY
Chlamydia: NEGATIVE
Comment: NEGATIVE
Comment: NORMAL
Neisseria Gonorrhea: NEGATIVE

## 2023-07-21 ENCOUNTER — Encounter: Payer: Self-pay | Admitting: Podiatry

## 2023-07-21 ENCOUNTER — Ambulatory Visit: Payer: Self-pay | Admitting: Podiatry

## 2023-07-21 VITALS — Ht 69.0 in | Wt 223.0 lb

## 2023-07-21 DIAGNOSIS — L600 Ingrowing nail: Secondary | ICD-10-CM | POA: Diagnosis not present

## 2023-07-21 DIAGNOSIS — B351 Tinea unguium: Secondary | ICD-10-CM | POA: Diagnosis not present

## 2023-07-21 NOTE — Patient Instructions (Signed)

## 2023-07-21 NOTE — Progress Notes (Unsigned)
 Subjective:  Patient ID: Gerald Gonzalez, male    DOB: 03-09-1976,  MRN: [id number]  Gerald Gonzalez presents to clinic today for:  Chief Complaint  Patient presents with   Nail Problem    Right hallux and right 3rd toe is very thick, appears to have fungus, and is growing sideways and cutting into toe next to it.  Patient presents for above complaint.  He has painful, thickened, dystrophic toenails that has difficulty maintaining himself specifically the right 1st and 3rd toenail.  The right third toenail is beginning to come to the toe next to it.  The toenails are also painful to their length.  He would like to discuss treatment options for this.  PCP is McGowen, Minetta Aly, MD.  No Known Allergies  Review of Systems: Negative except as noted in the HPI.  Objective:  There were no vitals filed for this visit.  DICK ANGELLO is a pleasant 48 y.o. male in NAD. AAO x 3.  Vascular Examination: Capillary refill time is less than 3 seconds to toes bilateral. Palpable pedal pulses b/l LE. Digital hair present b/l. No pedal edema b/l. Skin temperature gradient WNL b/l. No varicosities b/l. No cyanosis or clubbing noted b/l.   Dermatological Examination: The right 1st and 3rd toenail are painful on direct dorsal palpation.  The nails are thickened, elongated, dystrophic with yellow discoloration.  First toenail specially thickened.  First toenail is angled laterally in cutting into the second toe.  The third toenail is painful as well and curling into the third toe causing pain.  Neurological Examination: Protective sensation intact with Semmes-Weinstein 10 gram monofilament b/l LE. Vibratory sensation intact b/l LE.     Latest Ref Rng & Units 12/02/2022    1:22 PM  Hemoglobin A1C  Hemoglobin-A1c 4.6 - 6.5 % 5.9      Assessment/Plan: 1. Onychomycosis of right great toe   2. Ingrown toenail of right foot     No orders of the defined types were placed in this  encounter.  Signs and symptoms of onychomycosis with nail dystrophy discussed with patient.  He would like to proceed with removal of the first toenail right foot.  Right third toenail was debrided in thickness and length using sterile nail nipper without incident.  Discussed patient's condition today.  After obtaining patient consent, the right hallux was anesthetized with a 50:50 mixture of 1% lidocaine  plain and 0.5% bupivacaine plain for a total of 3cc's administered.  Upon confirmation of anesthesia, a freer elevator was utilized to free the right hallux nail plate from the nail bed.  The nail plate was then avulsed proximal to the eponychium and removed in toto.  The area was inspected for any remaining spicules.  A chemical matrixectomy was deferred. Antibiotic ointment and a DSD were applied, followed by a Coban dressing.  Patient tolerated the anesthetic and procedure well and will f/u in 2-3 weeks for recheck.  Patient given post-procedure instructions for daily 20-minute Epsom salt soaks, antibiotic ointment and daily use of Bandaids until toe starts to dry / form eschar.    Return in about 2 weeks (around 08/04/2023) for Nail Check.   Eve Hinders, DPM, AACFAS [facility]     [address]  [place], [place] [address]                Office [phone]  Fax [phone]

## 2023-07-24 ENCOUNTER — Other Ambulatory Visit: Payer: Self-pay | Admitting: Podiatry

## 2023-07-24 DIAGNOSIS — B353 Tinea pedis: Secondary | ICD-10-CM

## 2023-07-24 MED ORDER — TERBINAFINE HCL 250 MG PO TABS
250.0000 mg | ORAL_TABLET | Freq: Every day | ORAL | 0 refills | Status: AC
Start: 2023-07-24 — End: 2023-08-14

## 2023-07-24 NOTE — Progress Notes (Signed)
 Oral terbinafine sent into patient's pharmacy for fungal skin infection with residual fungal debris to nail avulsion site.

## 2023-08-04 ENCOUNTER — Ambulatory Visit: Admitting: Podiatry

## 2023-08-04 ENCOUNTER — Encounter: Payer: Self-pay | Admitting: Podiatry

## 2023-08-04 DIAGNOSIS — L600 Ingrowing nail: Secondary | ICD-10-CM | POA: Diagnosis not present

## 2023-08-04 NOTE — Progress Notes (Signed)
       Subjective:  Patient ID: Gerald Gonzalez, male    DOB: 1975/05/03,  MRN: [id number]  Chief Complaint  Patient presents with   Nail Problem    [name] Right foot big toe nail follow up patient states he is doing well no signs of infection.    [name] presents to clinic today for f/u of total nail avulsion to the right hallux.  He is doing well with this.  No pain.  PCP is McGowen, Minetta Aly, MD.  No Known Allergies  Objective:  There were no vitals filed for this visit.  Vascular Examination: Capillary refill time is less than 3 seconds to toes bilateral. Palpable pedal pulses b/l LE. Digital hair present b/l. No pedal edema b/l. Skin temperature gradient WNL b/l. No varicosities b/l. No cyanosis or clubbing noted b/l.   Dermatological Examination: Upon inspection of the PNA site, there are no clinical signs of infection.  No purulence, no necrosis, no malodor present.  Dry scab forming along the nailbed.  Does have thin layer of well adhered hyperkeratotic tissue to nail bed vs thin layer of remaining nail  Assessment/Plan: 1. Ingrown toenail of right foot     No orders of the defined types were placed in this encounter.  Findings discussed with patient.  Doing well from a pain standpoint.  Advise continuing to soak the next couple days while the scab continues to form. Discontinue use of antibiotic ointment.  Did discuss findings of possible residual nail vs underlying callus. It is difficult to determine at this point.  I explained that there are times with onychomycotic nails that there are multiple layers of nail growth.  I advised that they monitor the site for any signs of nail growth, if there is residual nail we could treat this with nail avulsion as well.  Return in about 9 weeks (around 10/06/2023) for Routine Foot Care.   Domique Reardon L. Lunda Salines, AACFAS [facility]     [address] [place],  [place] [address]                Office [phone]  Fax [phone]

## 2024-01-12 ENCOUNTER — Encounter: Admitting: Family Medicine

## 2024-02-02 ENCOUNTER — Ambulatory Visit (INDEPENDENT_AMBULATORY_CARE_PROVIDER_SITE_OTHER): Admitting: Family Medicine

## 2024-02-02 ENCOUNTER — Encounter: Payer: Self-pay | Admitting: Family Medicine

## 2024-02-02 VITALS — BP 146/82 | HR 97 | Temp 97.8°F | Ht 69.5 in | Wt 228.4 lb

## 2024-02-02 DIAGNOSIS — I10 Essential (primary) hypertension: Secondary | ICD-10-CM

## 2024-02-02 DIAGNOSIS — R7303 Prediabetes: Secondary | ICD-10-CM | POA: Diagnosis not present

## 2024-02-02 DIAGNOSIS — Z1211 Encounter for screening for malignant neoplasm of colon: Secondary | ICD-10-CM

## 2024-02-02 DIAGNOSIS — Z125 Encounter for screening for malignant neoplasm of prostate: Secondary | ICD-10-CM | POA: Diagnosis not present

## 2024-02-02 DIAGNOSIS — Z0000 Encounter for general adult medical examination without abnormal findings: Secondary | ICD-10-CM | POA: Diagnosis not present

## 2024-02-02 LAB — COMPREHENSIVE METABOLIC PANEL WITH GFR
ALT: 27 U/L (ref 0–53)
AST: 25 U/L (ref 0–37)
Albumin: 4.4 g/dL (ref 3.5–5.2)
Alkaline Phosphatase: 57 U/L (ref 39–117)
BUN: 16 mg/dL (ref 6–23)
CO2: 29 meq/L (ref 19–32)
Calcium: 9.3 mg/dL (ref 8.4–10.5)
Chloride: 102 meq/L (ref 96–112)
Creatinine, Ser: 1.26 mg/dL (ref 0.40–1.50)
GFR: 66.64 mL/min (ref >60.00)
Glucose, Bld: 81 mg/dL (ref 70–99)
Potassium: 4 meq/L (ref 3.5–5.1)
Sodium: 139 meq/L (ref 135–145)
Total Bilirubin: 0.6 mg/dL (ref 0.2–1.2)
Total Protein: 7.3 g/dL (ref 6.0–8.3)

## 2024-02-02 LAB — CBC WITH DIFFERENTIAL/PLATELET
Basophils Absolute: 0 K/uL (ref 0.0–0.1)
Basophils Relative: 0.6 % (ref 0.0–3.0)
Eosinophils Absolute: 0.3 K/uL (ref 0.0–0.7)
Eosinophils Relative: 6.2 % — ABNORMAL HIGH (ref 0.0–5.0)
HCT: 46.5 % (ref 39.0–52.0)
Hemoglobin: 15.5 g/dL (ref 13.0–17.0)
Lymphocytes Relative: 51.5 % — ABNORMAL HIGH (ref 12.0–46.0)
Lymphs Abs: 2.2 K/uL (ref 0.7–4.0)
MCHC: 33.3 g/dL (ref 30.0–36.0)
MCV: 88 fl (ref 78.0–100.0)
Monocytes Absolute: 0.4 K/uL (ref 0.1–1.0)
Monocytes Relative: 9.8 % (ref 3.0–12.0)
Neutro Abs: 1.3 K/uL — ABNORMAL LOW (ref 1.4–7.7)
Neutrophils Relative %: 31.9 % — ABNORMAL LOW (ref 43.0–77.0)
Platelets: 199 K/uL (ref 150.0–400.0)
RBC: 5.28 Mil/uL (ref 4.22–5.81)
RDW: 13.3 % (ref 11.5–15.5)
WBC: 4.2 K/uL (ref 4.0–10.5)

## 2024-02-02 LAB — LIPID PANEL
Cholesterol: 194 mg/dL (ref 0–200)
HDL: 50.3 mg/dL (ref >39.00)
LDL Cholesterol: 127 mg/dL — ABNORMAL HIGH (ref 0–99)
NonHDL: 144.19
Total CHOL/HDL Ratio: 4
Triglycerides: 88 mg/dL (ref 0.0–149.0)
VLDL: 17.6 mg/dL (ref 0.0–40.0)

## 2024-02-02 LAB — PSA: PSA: 1.77 ng/mL (ref 0.10–4.00)

## 2024-02-02 LAB — TSH: TSH: 1.07 u[IU]/mL (ref 0.35–5.50)

## 2024-02-02 NOTE — Patient Instructions (Signed)
 Normal blood pressure is <130 on top and <80 on bottom.

## 2024-02-02 NOTE — Progress Notes (Signed)
 Office Note 02/02/2024  CC:  Chief Complaint  Patient presents with   Annual Exam    Pt is fasting   Patient is a 48 y.o. male who is here for annual health maintenance exam and 70-month follow-up hypertension and prediabetes. A/P as of last visit: #1 hypertension with whitecoat component. Encouraged him to check his blood pressure a couple of times per month with his home cuff. Continue Toprol -XL 25 mg a day and HCTZ 25 mg a day. Basic metabolic panel today.   2.  STD screening. HIV antibody, RPR, and urine GC chlamydia ordered today.   #3 prediabetes. Nonfasting glucose today. We chose to defer hemoglobin A1c until next follow-up in 6 months. He will be working on improving diet.  He will continue great exercise habits.  INTERIM HX: Bryson feels well. Diet is not that good lately. Exercise habits have fallen off a little bit as well.  The Toprol  makes him feel tired. 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  In fact, he stopped taking his HCTZ as well and his wife has been monitoring his blood pressure at home on the weekends and has been consistently normal per her report (she is a publishing rights manager).  Past Medical History:  Diagnosis Date   Atypical chest pain    Hypertension    Obesity, Class I, BMI 30-34.9    Prediabetes 05/2021   05/2021 a1c 6.0%    History reviewed. No pertinent surgical history.  Family History  Problem Relation Age of Onset   Hypertension Mother    Hypertension Father    Stroke Father    Hypertension Brother    Dementia Maternal Grandfather    Stroke Paternal Grandmother     Social History   Socioeconomic History   Marital status: Married    Spouse name: Not on file   Number of children: Not on file   Years of education: Not on file   Highest education level: Not on file  Occupational History   Not on file  Tobacco Use   Smoking status: Never    Smokeless tobacco: Never  Vaping Use   Vaping status: Never Used  Substance and Sexual Activity   Alcohol use: Never   Drug use: Never   Sexual activity: Not on file  Other Topics Concern   Not on file  Social History Narrative   Married, 1 son.   Orig from [place], [place].   Lives in [place], [place].     Works in it consultant.   No T/A/Ds.   Social Drivers of Corporate Investment Banker Strain: Low Risk  (07/14/2023)   Overall Financial Resource Strain (CARDIA)    Difficulty of Paying Living Expenses: Not hard at all  Food Insecurity: No Food Insecurity (07/14/2023)   Hunger Vital Sign    Worried About Running Out of Food in the Last Year: Never true    Ran Out of Food in the Last Year: Never true  Transportation Needs: No Transportation Needs (07/14/2023)   PRAPARE - Administrator, Civil Service (Medical): No    Lack of Transportation (Non-Medical): No  Physical Activity: Insufficiently Active (07/14/2023)   Exercise Vital Sign    Days of Exercise per Week: 3 days    Minutes of Exercise per Session: 20 min  Stress: No Stress Concern Present (07/14/2023)   [employer] of Occupational Health - Occupational Stress  Questionnaire    Feeling of Stress : Only a little  Social Connections: Unknown (07/14/2023)   Social Connection and Isolation Panel    Frequency of Communication with Friends and Family: More than three times a week    Frequency of Social Gatherings with Friends and Family: Once a week    Attends Religious Services: Patient declined    Database Administrator or Organizations: No    Attends Engineer, Structural: Not on file    Marital Status: Married  Intimate Partner Violence: Unknown (06/25/2021)   Received from [facility]   HITS    Physically Hurt: Not on file    Insult or Talk Down To: Not on file    Threaten Physical Harm: Not on file    Scream or Curse: Not on file    Outpatient Medications Prior to Visit   Medication Sig Dispense Refill   hydrochlorothiazide  (HYDRODIURIL ) 25 MG tablet Take 1 tablet (25 mg total) by mouth daily. 90 tablet 1   metoprolol  succinate (TOPROL -XL) 25 MG 24 hr tablet Take 1 tablet (25 mg total) by mouth daily. 90 tablet 1   No facility-administered medications prior to visit.    No Known Allergies  Review of Systems  Constitutional:  Negative for appetite change, chills, fatigue and fever.  HENT:  Negative for congestion, dental problem, ear pain and sore throat.   Eyes:  Negative for discharge, redness and visual disturbance.  Respiratory:  Negative for cough, chest tightness, shortness of breath and wheezing.   Cardiovascular:  Negative for chest pain, palpitations and leg swelling.  Gastrointestinal:  Negative for abdominal pain, blood in stool, diarrhea, nausea and vomiting.  Genitourinary:  Negative for difficulty urinating, dysuria, flank pain, frequency, hematuria and urgency.  Musculoskeletal:  Negative for arthralgias, back pain, joint swelling, myalgias and neck stiffness.  Skin:  Negative for pallor and rash.  Neurological:  Negative for dizziness, speech difficulty, weakness and headaches.  Hematological:  Negative for adenopathy. Does not bruise/bleed easily.  Psychiatric/Behavioral:  Negative for confusion and sleep disturbance. The patient is not nervous/anxious.     PE;    02/02/2024    1:10 PM 02/02/2024    1:00 PM 07/21/2023   11:42 AM  Vitals with BMI  Height  5' 9.5 5' 9  Weight  228 lbs 6 oz 223 lbs  BMI  33.26 32.92  Systolic 146 158   Diastolic 82 79   Pulse  97      Gen: Alert, well appearing.  Patient is oriented to person, place, time, and situation. AFFECT: pleasant, lucid thought and speech. ENT: Ears: EACs clear, normal epithelium.  TMs with good light reflex and landmarks bilaterally.  Eyes: no injection, icteris, swelling, or exudate.  EOMI, PERRLA. Nose: no drainage or turbinate edema/swelling.  No injection or focal  lesion.  Mouth: lips without lesion/swelling.  Oral mucosa pink and moist.  Dentition intact and without obvious caries or gingival swelling.  Oropharynx without erythema, exudate, or swelling.  Neck: supple/nontender.  No LAD, mass, or TM.  Carotid pulses 2+ bilaterally, without bruits. CV: RRR, no m/r/g.   LUNGS: CTA bilat, nonlabored resps, good aeration in all lung fields. ABD: soft, NT, ND, BS normal.  No hepatospenomegaly or mass.  No bruits. EXT: no clubbing, cyanosis, or edema.  Musculoskeletal: no joint swelling, erythema, warmth, or tenderness.  ROM of all joints intact. Skin - no sores or suspicious lesions or rashes or color changes  Pertinent labs:  Lab Results  Component Value Date   TSH 0.63 12/02/2022   Lab Results  Component Value Date   WBC 3.9 (L) 12/02/2022   HGB 15.7 12/02/2022   HCT 47.7 12/02/2022   MCV 88.4 12/02/2022   PLT 192.0 12/02/2022   Lab Results  Component Value Date   CREATININE 1.20 07/14/2023   BUN 15 07/14/2023   NA 141 07/14/2023   K 4.0 07/14/2023   CL 105 07/14/2023   CO2 28 07/14/2023   Lab Results  Component Value Date   ALT 30 12/02/2022   AST 28 12/02/2022   ALKPHOS 58 12/02/2022   BILITOT 0.7 12/02/2022   Lab Results  Component Value Date   CHOL 183 12/02/2022   Lab Results  Component Value Date   HDL 53.30 12/02/2022   Lab Results  Component Value Date   LDLCALC 117 (H) 12/02/2022   Lab Results  Component Value Date   TRIG 64.0 12/02/2022   Lab Results  Component Value Date   CHOLHDL 3 12/02/2022   Lab Results  Component Value Date   HGBA1C 5.9 12/02/2022   ASSESSMENT AND PLAN:   #1 health maintenance exam: Reviewed age and gender appropriate health maintenance issues (prudent diet, regular exercise, health risks of tobacco and excessive alcohol, use of seatbelts, fire alarms in home, use of sunscreen).  Also reviewed age and gender appropriate health screening as well as vaccine recommendations. Vaccines:  Flu-> declined. Prevnar 20-->declined.  Shingrix->declined. Labs: Health panel labs ordered, also Hba1c (prediabetes). Prostate ca screening: Initial screening today --> PSA. Colon ca screening: due for initial screening.  Options discusssed--> he will consider.  #2 hypertension, blood pressure normal lately on home measurements without medication. Continue home monitoring and if blood pressure consistently greater than 130/80 then he will restart his HCTZ. Will take Toprol  off his med list and put it on intolerance list--> fatigue.  #3 prediabetes. Hemoglobin A1c 5.9% about 14 months ago. Check fasting glucose and hemoglobin A1c today.  An After Visit Summary was printed and given to the patient.  FOLLOW UP:  Return in about 6 months (around 08/01/2024) for routine chronic illness f/u.  Signed:  Gerlene Hockey, MD           02/02/2024

## 2024-02-05 ENCOUNTER — Ambulatory Visit: Payer: Self-pay | Admitting: Family Medicine

## 2024-02-05 LAB — HEMOGLOBIN A1C: Hgb A1c MFr Bld: 6 % (ref 4.6–6.5)

## 2024-08-02 ENCOUNTER — Ambulatory Visit: Admitting: Family Medicine
